# Patient Record
Sex: Male | Born: 1985 | Race: White | Hispanic: No | Marital: Married | Smoking: Light tobacco smoker
Health system: Southern US, Community
[De-identification: ages and names within clinical notes are randomized; demographics above are authoritative.]

## PROBLEM LIST (undated history)

## (undated) DIAGNOSIS — F909 Attention-deficit hyperactivity disorder, unspecified type: Secondary | ICD-10-CM

## (undated) DIAGNOSIS — J45909 Unspecified asthma, uncomplicated: Secondary | ICD-10-CM

## (undated) DIAGNOSIS — F429 Obsessive-compulsive disorder, unspecified: Secondary | ICD-10-CM

## (undated) DIAGNOSIS — I517 Cardiomegaly: Secondary | ICD-10-CM

## (undated) HISTORY — PX: HERNIA REPAIR: SHX51

## (undated) HISTORY — PX: COLONOSCOPY: SHX174

---

## 2016-01-30 ENCOUNTER — Encounter (HOSPITAL_COMMUNITY): Payer: Self-pay | Admitting: Emergency Medicine

## 2016-01-30 ENCOUNTER — Emergency Department (HOSPITAL_COMMUNITY)
Admission: EM | Admit: 2016-01-30 | Discharge: 2016-01-30 | Disposition: A | Discharge status: Home / Self Care | Payer: Self-pay | Attending: Emergency Medicine | Admitting: Emergency Medicine

## 2016-01-30 ENCOUNTER — Emergency Department (HOSPITAL_COMMUNITY): Payer: Self-pay

## 2016-01-30 DIAGNOSIS — F909 Attention-deficit hyperactivity disorder, unspecified type: Secondary | ICD-10-CM | POA: Insufficient documentation

## 2016-01-30 DIAGNOSIS — K1120 Sialoadenitis, unspecified: Secondary | ICD-10-CM

## 2016-01-30 DIAGNOSIS — R590 Localized enlarged lymph nodes: Secondary | ICD-10-CM

## 2016-01-30 HISTORY — DX: Attention-deficit hyperactivity disorder, unspecified type: F90.9

## 2016-01-30 HISTORY — DX: Obsessive-compulsive disorder, unspecified: F42.9

## 2016-01-30 LAB — CBC WITH DIFFERENTIAL/PLATELET
BASOS ABS: 0 10*3/uL (ref 0.0–0.1)
Basophils Relative: 1 %
Eosinophils Absolute: 0.2 10*3/uL (ref 0.0–0.7)
Eosinophils Relative: 2 %
HEMATOCRIT: 43.3 % (ref 39.0–52.0)
HEMOGLOBIN: 14.4 g/dL (ref 13.0–17.0)
LYMPHS PCT: 17 %
Lymphs Abs: 1.4 10*3/uL (ref 0.7–4.0)
MCH: 29.6 pg (ref 26.0–34.0)
MCHC: 33.3 g/dL (ref 30.0–36.0)
MCV: 88.9 fL (ref 78.0–100.0)
Monocytes Absolute: 0.8 10*3/uL (ref 0.1–1.0)
Monocytes Relative: 10 %
NEUTROS ABS: 5.7 10*3/uL (ref 1.7–7.7)
NEUTROS PCT: 70 %
Platelets: 173 10*3/uL (ref 150–400)
RBC: 4.87 MIL/uL (ref 4.22–5.81)
RDW: 13.2 % (ref 11.5–15.5)
WBC: 8.1 10*3/uL (ref 4.0–10.5)

## 2016-01-30 LAB — BASIC METABOLIC PANEL
ANION GAP: 6 (ref 5–15)
BUN: 15 mg/dL (ref 6–20)
CO2: 24 mmol/L (ref 22–32)
Calcium: 8.9 mg/dL (ref 8.9–10.3)
Chloride: 104 mmol/L (ref 101–111)
Creatinine, Ser: 0.79 mg/dL (ref 0.61–1.24)
GLUCOSE: 115 mg/dL — AB (ref 65–99)
POTASSIUM: 3.8 mmol/L (ref 3.5–5.1)
SODIUM: 134 mmol/L — AB (ref 135–145)

## 2016-01-30 MED ORDER — AMOXICILLIN-POT CLAVULANATE 875-125 MG PO TABS
1.0000 | ORAL_TABLET | Freq: Once | ORAL | Status: AC
  Administered 2016-01-30: 1 via ORAL
  Filled 2016-01-30: qty 1

## 2016-01-30 MED ORDER — SODIUM CHLORIDE 0.9 % IV BOLUS (SEPSIS)
1000.0000 mL | Freq: Once | INTRAVENOUS | Status: AC
  Administered 2016-01-30: 1000 mL via INTRAVENOUS

## 2016-01-30 MED ORDER — IOPAMIDOL (ISOVUE-300) INJECTION 61%
75.0000 mL | Freq: Once | INTRAVENOUS | Status: AC | PRN
  Administered 2016-01-30: 75 mL via INTRAVENOUS

## 2016-01-30 MED ORDER — AMOXICILLIN-POT CLAVULANATE 875-125 MG PO TABS: 1.0000 | ORAL_TABLET | Freq: Two times a day (BID) | ORAL | 0 refills | Status: DC

## 2016-01-30 MED ORDER — KETOROLAC TROMETHAMINE 30 MG/ML IJ SOLN
30.0000 mg | Freq: Once | INTRAMUSCULAR | Status: AC
  Administered 2016-01-30: 30 mg via INTRAVENOUS
  Filled 2016-01-30: qty 1

## 2016-01-30 MED ORDER — DEXAMETHASONE SODIUM PHOSPHATE 10 MG/ML IJ SOLN
10.0000 mg | Freq: Once | INTRAMUSCULAR | Status: AC
  Administered 2016-01-30: 10 mg via INTRAVENOUS
  Filled 2016-01-30: qty 1

## 2016-01-30 NOTE — Discharge Instructions (Signed)
As discussed, with infection and is important to take all medication as directed, and be sure to follow-up with your physician. In addition, for the next 3 days please use ibuprofen, 400 mg, 3 times daily.  Return here for concerning changes in your condition.

## 2016-01-30 NOTE — ED Provider Notes (Signed)
[facility]-EMERGENCY DEPT Provider Note   CSN: [id number] Arrival date & time: 01/30/16  1242  By signing my name below, I, Rosario AdieWilliam Andrew Hiatt, attest that this documentation has been prepared under the direction and in the presence of Gerhard Munchobert Darely Becknell, MD. Electronically Signed: Rosario AdieWilliam Andrew Hiatt, ED Scribe. 01/30/16. 1:20 PM.  History   Chief Complaint Chief Complaint  Patient presents with  . Facial Pain   The history is provided by the patient and the spouse. No language interpreter was used.    HPI Comments: Bruce Nguyen is a 30 y.o. male who presents to the Emergency Department complaining of gradually worsening area of pain and mild swelling onset approximately 2 days ago. He states that initial his pain began in the right side of his face, and it is now radiating downward into the right-side of his neck. His pain is exacerbated with palpation and direct pressure.  Pt has been taking Tylenol at home for his pain with minimal relief. Per wife, he also has been c/o right-sided headaches over the past several weeks as well. She states that he wakes up with these headaches, and Tylenol has not been providing relief either. No recent falls or trauma. Pt is a non-smoker and an occasional social drinker. No h/o DM. He denies loss of hearing, difficulty swallowing, voice changes, shortness of breath, fever, sore throat, dental pain, or any other associated symptoms.   Past Medical History:  Diagnosis Date  . ADHD   . OCD (obsessive compulsive disorder)    There are no active problems to display for this patient.  Past Surgical History:  Procedure Laterality Date  . COLONOSCOPY      Home Medications    Prior to Admission medications   Medication Sig Start Date End Date Taking? Authorizing Provider  albuterol (PROVENTIL HFA;VENTOLIN HFA) 108 (90 Base) MCG/ACT inhaler Inhale 1-2 puffs into the lungs every 6 (six) hours as needed for wheezing or shortness of breath.   Yes Historical  Provider, MD  amoxicillin-clavulanate (AUGMENTIN) 875-125 MG tablet Take 1 tablet by mouth every 12 (twelve) hours. 01/30/16   Gerhard Munchobert Lyndzie Zentz, MD   Family History Family History  Problem Relation Age of Onset  . Diabetes Father   . Diabetes Other   . Thyroid disease Sister    Social History Social History  Substance Use Topics  . Smoking status: Never Smoker  . Smokeless tobacco: Never Used  . Alcohol use Yes     Comment: occas   Allergies   Patient has no known allergies.  Review of Systems Review of Systems  Constitutional: Negative for fever.  HENT: Negative for dental problem, hearing loss, sore throat, trouble swallowing and voice change.   Respiratory: Negative for shortness of breath.   Musculoskeletal: Positive for myalgias and neck pain.  Neurological: Positive for headaches.  All other systems reviewed and are negative.  Physical Exam Updated Vital Signs BP 108/64   Pulse 83   Temp 98.2 F (36.8 C) (Oral)   Resp 16   Ht 6' (1.829 m)   Wt 290 lb (131.5 kg)   SpO2 96%   BMI 39.33 kg/m   Physical Exam  Constitutional: He appears well-developed and well-nourished. No distress.  HENT:  Head: Normocephalic and atraumatic.  Right Ear: Tympanic membrane and ear canal normal. Tympanic membrane is not erythematous.  Left Ear: Tympanic membrane and ear canal normal. Tympanic membrane is not erythematous.  Mouth/Throat: Uvula is midline, oropharynx is clear and moist and mucous membranes are normal.  No trismus in the jaw. No uvula swelling. No oropharyngeal exudate, posterior oropharyngeal edema, posterior oropharyngeal erythema or tonsillar abscesses. No tonsillar exudate.  Preauricular adenopathy and cervical lymphadenopathy on the right side is noted. Teeth are otherwise unremarkable.   Eyes: Conjunctivae are normal.  Neck: Normal range of motion.  Cardiovascular: Normal rate, regular rhythm and normal heart sounds.   No murmur heard. Pulmonary/Chest: Effort  normal and breath sounds normal. No respiratory distress. He has no wheezes. He has no rales.  Abdominal: He exhibits no distension.  Musculoskeletal: Normal range of motion.  Neurological: He is alert.  Skin: No pallor.  Psychiatric: He has a normal mood and affect. His behavior is normal.  Nursing note and vitals reviewed.  ED Treatments / Results  DIAGNOSTIC STUDIES: Oxygen Saturation is 97% on RA, normal by my interpretation.   COORDINATION OF CARE: 1:20 PM-Discussed next steps with pt. Pt verbalized understanding and is agreeable with the plan.   Labs (all labs ordered are listed, but only abnormal results are displayed) Labs Reviewed  BASIC METABOLIC PANEL - Abnormal; Notable for the following:       Result Value   Sodium 134 (*)    Glucose, Bld 115 (*)    All other components within normal limits  CBC WITH DIFFERENTIAL/PLATELET    EKG  EKG Interpretation None      Radiology Ct Soft Tissue Neck W Contrast  Result Date: 01/30/2016 CLINICAL DATA:  Right lateral neck pain and swelling. Palpable nodule just below the right ear. EXAM: CT NECK WITH CONTRAST TECHNIQUE: Multidetector CT imaging of the neck was performed using the standard protocol following the bolus administration of intravenous contrast. CONTRAST:  75mL ISOVUE-300 IOPAMIDOL (ISOVUE-300) INJECTION 61% COMPARISON:  None. FINDINGS: Pharynx and larynx: The palatine tonsils are enlarged bilaterally. The soft palate and adenoid tissue is enlarged. The airway is patent although narrowed in the posterior oropharynx. There is prominence of the lingual tonsils. No focal mucosal or submucosal mass lesion is present. Salivary glands: A 10 mm hyperdense nodule within the right parotid gland likely represents a lymph node. No other focal lesions are present within either parotid gland. The submandibular glands are within normal limits. Thyroid: Negative Lymph nodes: Enlarged lymph node with some surrounding inflammatory  changes is present adjacent to the superior aspect of the right parotid gland, just anterior and inferior to the right external auditory canal. Prominent bilateral level 2 lymph nodes are noted. subcentimeter submandibular lymph nodes are present as well. Asymmetric subcentimeter level 3 nodes are present on the right. Vascular: No significant vascular lesions are present. Limited intracranial: Unremarkable. Visualized orbits: The globes and orbits are within normal limits. Mastoids and visualized paranasal sinuses: The mastoids and paranasal sinuses are clear. Skeleton: No focal lytic or blastic lesions are present. AP alignment is within normal limits in the cervical spine. Upper chest: The lung apices are clear. The superior mediastinum is within normal limits. IMPRESSION: 1. Inflammatory lymph node of the upper pole of the right parotid gland likely accounts for the full palpable lesion. This may be related to parotitis. No obstructing mass lesion is present. 2. Probable right intra parotid lymph node. Benign parotid neoplasm is considered less likely. Recommend follow-up CT of the neck with contrast at 3- 6 months. 3. Bilateral cervical adenopathy, right greater than left. This appears reactive. 4. Extensive prominence of the palatine tonsils, soft palate, adenoid tissues suggesting pharyngitis. No discrete mass lesion or abscess is evident. Electronically Signed   By: Cristal Deer  Mattern M.D.   On: 01/30/2016 15:01    Procedures Procedures   Medications Ordered in ED Medications  amoxicillin-clavulanate (AUGMENTIN) 875-125 MG per tablet 1 tablet (not administered)  sodium chloride 0.9 % bolus 1,000 mL (0 mLs Intravenous Stopped 01/30/16 1439)  ketorolac (TORADOL) 30 MG/ML injection 30 mg (30 mg Intravenous Given 01/30/16 1342)  dexamethasone (DECADRON) injection 10 mg (10 mg Intravenous Given 01/30/16 1342)  iopamidol (ISOVUE-300) 61 % injection 75 mL (75 mLs Intravenous Contrast Given 01/30/16 1427)      Initial Impression / Assessment and Plan / ED Course  I have reviewed the triage vital signs and the nursing notes.  Pertinent labs & imaging results that were available during my care of the patient were reviewed by me and considered in my medical decision making (see chart for details).  Clinical Course   On repeat exam the patient's pain has diminished substantially. I discussed all findings with him and his wife. We discussed important to take all medication as directed, return precautions and follow-up instructions. With evidence for peritonitis, adenopathy, no evidence for bacteremia or sepsis, or respiratory compromise, the patient was discharged after initiation of antibiotics here.   Final Clinical Impressions(s) / ED Diagnoses  Parotitis Cervical adenopathy  New Prescriptions New Prescriptions   AMOXICILLIN-CLAVULANATE (AUGMENTIN) 875-125 MG TABLET    Take 1 tablet by mouth every 12 (twelve) hours.   I personally performed the services described in this documentation, which was scribed in my presence. The recorded information has been reviewed and is accurate.     Gerhard Munchobert Kymora Sciara, MD 01/30/16 1539

## 2016-01-30 NOTE — ED Triage Notes (Signed)
Pt reports a small area of inflammation to his R face at the ear area. Pt states the symptoms have increased with the size of the inflammation now going from in front of his ear down into his neck. Pt states he is unable to turn his head to the R.

## 2016-07-14 ENCOUNTER — Emergency Department (HOSPITAL_COMMUNITY)
Admission: EM | Admit: 2016-07-14 | Discharge: 2016-07-14 | Disposition: A | Discharge status: Home / Self Care | Payer: Self-pay | Attending: Emergency Medicine | Admitting: Emergency Medicine

## 2016-07-14 ENCOUNTER — Emergency Department (HOSPITAL_COMMUNITY): Payer: Self-pay

## 2016-07-14 ENCOUNTER — Encounter (HOSPITAL_COMMUNITY): Payer: Self-pay | Admitting: *Deleted

## 2016-07-14 DIAGNOSIS — R0789 Other chest pain: Secondary | ICD-10-CM | POA: Insufficient documentation

## 2016-07-14 DIAGNOSIS — J45909 Unspecified asthma, uncomplicated: Secondary | ICD-10-CM | POA: Insufficient documentation

## 2016-07-14 DIAGNOSIS — F909 Attention-deficit hyperactivity disorder, unspecified type: Secondary | ICD-10-CM | POA: Insufficient documentation

## 2016-07-14 DIAGNOSIS — R0602 Shortness of breath: Secondary | ICD-10-CM | POA: Insufficient documentation

## 2016-07-14 HISTORY — DX: Cardiomegaly: I51.7

## 2016-07-14 HISTORY — DX: Unspecified asthma, uncomplicated: J45.909

## 2016-07-14 MED ORDER — IBUPROFEN 600 MG PO TABS: 600.0000 mg | ORAL_TABLET | Freq: Three times a day (TID) | ORAL | 0 refills | Status: DC | PRN

## 2016-07-14 MED ORDER — OXYCODONE-ACETAMINOPHEN 5-325 MG PO TABS: 1.0000 | ORAL_TABLET | ORAL | 0 refills | Status: DC | PRN

## 2016-07-14 NOTE — ED Provider Notes (Signed)
[facility] Provider Note   CSN: [id number] Arrival date & time: 07/14/16  1654     History   Chief Complaint Chief Complaint  Patient presents with  . Shortness of Breath    HPI Bruce Nguyen is a 31 y.o. male.  HPI Patient presents with chest pain. Began acutely one week ago when he woke up. Worse with movements and breathing. No fevers. No cough. No trauma. Has not had pains like this before. Has been told that a enlarged heart in the past but states he did not take the medicine but it just went away. No swelling in his legs. Does not smoke. No recent illnesses. No real difficulty breathing but states it does get a little short of breath when he takes a deep breath, due to the pain.  Past Medical History:  Diagnosis Date  . ADHD   . Asthma   . Enlarged heart   . OCD (obsessive compulsive disorder)     There are no active problems to display for this patient.   Past Surgical History:  Procedure Laterality Date  . COLONOSCOPY         Home Medications    Prior to Admission medications   Medication Sig Start Date End Date Taking? Authorizing Provider  albuterol (PROVENTIL HFA;VENTOLIN HFA) 108 (90 Base) MCG/ACT inhaler Inhale 1-2 puffs into the lungs every 6 (six) hours as needed for wheezing or shortness of breath.    [provider]  amoxicillin-clavulanate (AUGMENTIN) 875-125 MG tablet Take 1 tablet by mouth every 12 (twelve) hours. 01/30/16   Gerhard Munch, MD  ibuprofen (ADVIL,MOTRIN) 600 MG tablet Take 1 tablet (600 mg total) by mouth every 8 (eight) hours as needed. 07/14/16   Benjiman Core, MD  oxyCODONE-acetaminophen (PERCOCET/ROXICET) 5-325 MG tablet Take 1 tablet by mouth every 4 (four) hours as needed for severe pain. 07/14/16   Benjiman Core, MD    Family History Family History  Problem Relation Age of Onset  . Diabetes Father   . Diabetes Other   . Thyroid disease Sister     Social History Social History  Substance  Use Topics  . Smoking status: Never Smoker  . Smokeless tobacco: Never Used  . Alcohol use Yes     Comment: occas     Allergies   Patient has no known allergies.   Review of Systems Review of Systems  Constitutional: Negative for activity change, appetite change, chills and fever.  HENT: Negative for congestion.   Eyes: Negative for pain.  Respiratory: Negative for chest tightness and shortness of breath.   Cardiovascular: Positive for chest pain. Negative for leg swelling.  Gastrointestinal: Negative for abdominal pain, diarrhea, nausea and vomiting.  Genitourinary: Negative for flank pain.  Musculoskeletal: Negative for back pain and neck stiffness.  Skin: Negative for rash.  Neurological: Negative for weakness, numbness and headaches.  Psychiatric/Behavioral: Negative for behavioral problems.     Physical Exam Updated Vital Signs BP (!) 123/93 (BP Location: Right Arm)   Pulse 85   Temp 97.7 F (36.5 C) (Oral)   Resp 18   Ht 6\' 1"  (1.854 m)   Wt (!) 140.6 kg (310 lb)   SpO2 95%   BMI 40.90 kg/m   Physical Exam  Constitutional: He appears well-developed.  HENT:  Head: Atraumatic.  Cardiovascular: Normal rate.   Pulmonary/Chest: Effort normal. He exhibits tenderness.  Moderate tenderness over anterior lower chest. No crepitance. No deformity.  Abdominal: He exhibits no mass. There is no tenderness.  Musculoskeletal: He exhibits no edema.  Neurological: He is alert.  Skin: Skin is warm. Capillary refill takes less than 2 seconds.     ED Treatments / Results  Labs (all labs ordered are listed, but only abnormal results are displayed) Labs Reviewed - No data to display  EKG  EKG Interpretation  Date/Time:  Wednesday Jul 14 2016 17:48:36 EDT Ventricular Rate:  76 PR Interval:    QRS Duration: 91 QT Interval:  369 QTC Calculation: 415 R Axis:   81 Text Interpretation:  Sinus rhythm ST elev, probable normal early repol pattern Confirmed by Rubin PayorPICKERING   MD, Harrold DonathNATHAN [phone] on 07/14/2016 6:08:47 PM       Radiology Dg Chest 2 View  Result Date: 07/14/2016 CLINICAL DATA:  Worsening lower LEFT rib pain and shortness of breath for 1 week, dry cough today, history asthma, cardiomegaly EXAM: CHEST  2 VIEW COMPARISON:  None FINDINGS: Normal heart size, mediastinal contours, and pulmonary vascularity. Lungs clear. No pleural effusion or pneumothorax. Bones unremarkable. IMPRESSION: Normal exam. Electronically Signed   By: Ulyses SouthwardMark  Boles M.D.   On: 07/14/2016 18:14    Procedures Procedures (including critical care time)  Medications Ordered in ED Medications - No data to display   Initial Impression / Assessment and Plan / ED Course  I have reviewed the triage vital signs and the nursing notes.  Pertinent labs & imaging results that were available during my care of the patient were reviewed by me and considered in my medical decision making (see chart for details).     Patient with chest pain. Left anterior lower. Worse with palpitations. EKG reassuring. X-ray negative. With further questioning from the nurse later said that he did pick up a friend physically before the pain started. Doubt cardiac cause. Will discharge home.  Final Clinical Impressions(s) / ED Diagnoses   Final diagnoses:  Chest wall pain    New Prescriptions Discharge Medication List as of 07/14/2016  6:37 PM    START taking these medications   Details  ibuprofen (ADVIL,MOTRIN) 600 MG tablet Take 1 tablet (600 mg total) by mouth every 8 (eight) hours as needed., Starting Wed 07/14/2016, Print    oxyCODONE-acetaminophen (PERCOCET/ROXICET) 5-325 MG tablet Take 1 tablet by mouth every 4 (four) hours as needed for severe pain., Starting Wed 07/14/2016, Print         Benjiman CorePickering, Abrie Egloff, MD 07/14/16 [phone]

## 2016-07-14 NOTE — ED Notes (Signed)
Pt do recall horse playing with a friend and picked him to body slam.  Pt says pain is to left lower chest and goes around to his back, rating pain 8-9/10.

## 2016-07-14 NOTE — ED Triage Notes (Signed)
Pt comes in with left rib pain starting 1 week ago. Pt states it becomes are hard him to breathe at times. Denies any injury.

## 2017-04-18 ENCOUNTER — Other Ambulatory Visit: Payer: Self-pay

## 2017-04-18 ENCOUNTER — Emergency Department (HOSPITAL_COMMUNITY)
Admission: EM | Admit: 2017-04-18 | Discharge: 2017-04-18 | Disposition: A | Discharge status: Home / Self Care | Payer: Self-pay | Attending: Emergency Medicine | Admitting: Emergency Medicine

## 2017-04-18 ENCOUNTER — Encounter (HOSPITAL_COMMUNITY): Payer: Self-pay | Admitting: Emergency Medicine

## 2017-04-18 ENCOUNTER — Emergency Department (HOSPITAL_COMMUNITY): Payer: Self-pay

## 2017-04-18 DIAGNOSIS — J4541 Moderate persistent asthma with (acute) exacerbation: Secondary | ICD-10-CM | POA: Insufficient documentation

## 2017-04-18 DIAGNOSIS — F909 Attention-deficit hyperactivity disorder, unspecified type: Secondary | ICD-10-CM | POA: Insufficient documentation

## 2017-04-18 DIAGNOSIS — J45909 Unspecified asthma, uncomplicated: Secondary | ICD-10-CM | POA: Insufficient documentation

## 2017-04-18 DIAGNOSIS — F17200 Nicotine dependence, unspecified, uncomplicated: Secondary | ICD-10-CM | POA: Insufficient documentation

## 2017-04-18 MED ORDER — ALBUTEROL SULFATE (2.5 MG/3ML) 0.083% IN NEBU: 2.5000 mg | INHALATION_SOLUTION | RESPIRATORY_TRACT | 1 refills | Status: AC | PRN

## 2017-04-18 MED ORDER — PREDNISONE 20 MG PO TABS: ORAL_TABLET | ORAL | 0 refills | Status: DC

## 2017-04-18 MED ORDER — IPRATROPIUM-ALBUTEROL 0.5-2.5 (3) MG/3ML IN SOLN
3.0000 mL | Freq: Once | RESPIRATORY_TRACT | Status: AC
  Administered 2017-04-18: 3 mL via RESPIRATORY_TRACT
  Filled 2017-04-18: qty 3

## 2017-04-18 MED ORDER — ALBUTEROL SULFATE (2.5 MG/3ML) 0.083% IN NEBU
2.5000 mg | INHALATION_SOLUTION | Freq: Once | RESPIRATORY_TRACT | Status: AC
  Administered 2017-04-18: 2.5 mg via RESPIRATORY_TRACT
  Filled 2017-04-18: qty 3

## 2017-04-18 MED ORDER — ALBUTEROL SULFATE HFA 108 (90 BASE) MCG/ACT IN AERS
2.0000 | INHALATION_SPRAY | Freq: Once | RESPIRATORY_TRACT | Status: AC
  Administered 2017-04-18: 2 via RESPIRATORY_TRACT
  Filled 2017-04-18: qty 6.7

## 2017-04-18 MED ORDER — PREDNISONE 50 MG PO TABS
60.0000 mg | ORAL_TABLET | Freq: Once | ORAL | Status: AC
  Administered 2017-04-18: 60 mg via ORAL
  Filled 2017-04-18: qty 1

## 2017-04-18 NOTE — ED Provider Notes (Signed)
[facility] EMERGENCY DEPARTMENT Provider Note   CSN: [id number] Arrival date & time: 04/18/17  7829     History   Chief Complaint Chief Complaint  Patient presents with  . Shortness of Breath    HPI Bruce Nguyen is a 32 y.o. male.  Patient complains of shortness of breath and wheezing.  Patient has history of asthma   The history is provided by the patient. No language interpreter was used.  Shortness of Breath  This is a recurrent problem. The problem occurs frequently.The current episode started more than 2 days ago. The problem has not changed since onset.Associated symptoms include wheezing. Pertinent negatives include no fever, no headaches, no cough, no chest pain, no abdominal pain and no rash. It is unknown what precipitated the problem.    Past Medical History:  Diagnosis Date  . ADHD   . Asthma   . Enlarged heart   . OCD (obsessive compulsive disorder)     There are no active problems to display for this patient.   Past Surgical History:  Procedure Laterality Date  . COLONOSCOPY         Home Medications    Prior to Admission medications   Medication Sig Start Date End Date Taking? Authorizing Provider  albuterol (PROVENTIL HFA;VENTOLIN HFA) 108 (90 Base) MCG/ACT inhaler Inhale 1-2 puffs into the lungs every 6 (six) hours as needed for wheezing or shortness of breath.   Yes [provider]  ibuprofen (ADVIL,MOTRIN) 200 MG tablet Take 400 mg by mouth every 6 (six) hours as needed.   Yes [provider]  albuterol (PROVENTIL) (2.5 MG/3ML) 0.083% nebulizer solution Take 3 mLs (2.5 mg total) by nebulization every 4 (four) hours as needed for wheezing or shortness of breath. 04/18/17   Bethann Berkshire, MD  amoxicillin-clavulanate (AUGMENTIN) 875-125 MG tablet Take 1 tablet by mouth every 12 (twelve) hours. Patient not taking: Reported on 04/18/2017 01/30/16   Gerhard Munch, MD  ibuprofen (ADVIL,MOTRIN) 600 MG tablet Take 1 tablet (600  mg total) by mouth every 8 (eight) hours as needed. Patient not taking: Reported on 04/18/2017 07/14/16   Benjiman Core, MD  oxyCODONE-acetaminophen (PERCOCET/ROXICET) 5-325 MG tablet Take 1 tablet by mouth every 4 (four) hours as needed for severe pain. Patient not taking: Reported on 04/18/2017 07/14/16   Benjiman Core, MD  predniSONE (DELTASONE) 20 MG tablet 2 tabs po daily x 3 days 04/18/17   Bethann Berkshire, MD    Family History Family History  Problem Relation Age of Onset  . Diabetes Father   . Diabetes Other   . Thyroid disease Sister     Social History Social History   Tobacco Use  . Smoking status: Light Tobacco Smoker  . Smokeless tobacco: Never Used  Substance Use Topics  . Alcohol use: Yes    Comment: occas  . Drug use: No     Allergies   Patient has no known allergies.   Review of Systems Review of Systems  Constitutional: Negative for appetite change, fatigue and fever.  HENT: Negative for congestion, ear discharge and sinus pressure.   Eyes: Negative for discharge.  Respiratory: Positive for shortness of breath and wheezing. Negative for cough.   Cardiovascular: Negative for chest pain.  Gastrointestinal: Negative for abdominal pain and diarrhea.  Genitourinary: Negative for frequency and hematuria.  Musculoskeletal: Negative for back pain.  Skin: Negative for rash.  Neurological: Negative for seizures and headaches.  Psychiatric/Behavioral: Negative for hallucinations.     Physical Exam Updated Vital  Signs BP (!) 144/61   Pulse 99   Temp (!) 97.5 F (36.4 C) (Oral)   Resp 15   Ht 6\' 1"  (1.854 m)   Wt (!) 142.9 kg (315 lb)   SpO2 98%   BMI 41.56 kg/m   Physical Exam  Constitutional: He is oriented to person, place, and time. He appears well-developed.  HENT:  Head: Normocephalic.  Eyes: Conjunctivae and EOM are normal. No scleral icterus.  Neck: Neck supple. No thyromegaly present.  Cardiovascular: Normal rate and regular rhythm.  Exam reveals no gallop and no friction rub.  No murmur heard. Pulmonary/Chest: No stridor. He has wheezes. He has no rales. He exhibits no tenderness.  Abdominal: He exhibits no distension. There is no tenderness. There is no rebound.  Musculoskeletal: Normal range of motion. He exhibits no edema.  Lymphadenopathy:    He has no cervical adenopathy.  Neurological: He is oriented to person, place, and time. He exhibits normal muscle tone. Coordination normal.  Skin: No rash noted. No erythema.  Psychiatric: He has a normal mood and affect. His behavior is normal.     ED Treatments / Results  Labs (all labs ordered are listed, but only abnormal results are displayed) Labs Reviewed - No data to display  EKG  EKG Interpretation None       Radiology Dg Chest 2 View  Result Date: 04/18/2017 CLINICAL DATA:  Cough and shortness of breath since last night. EXAM: CHEST  2 VIEW COMPARISON:  PA and lateral chest 07/14/2016. FINDINGS: The lungs are clear. Heart size is normal. No pneumothorax or pleural fluid. No acute bony abnormality. IMPRESSION: Negative chest. Electronically Signed   By: Drusilla Kannerhomas  Dalessio M.D.   On: 04/18/2017 10:35    Procedures Procedures (including critical care time)  Medications Ordered in ED Medications  ipratropium-albuterol (DUONEB) 0.5-2.5 (3) MG/3ML nebulizer solution 3 mL (3 mLs Nebulization Given 04/18/17 1056)  predniSONE (DELTASONE) tablet 60 mg (60 mg Oral Given 04/18/17 1219)  ipratropium-albuterol (DUONEB) 0.5-2.5 (3) MG/3ML nebulizer solution 3 mL (3 mLs Nebulization Given 04/18/17 1333)  albuterol (PROVENTIL) (2.5 MG/3ML) 0.083% nebulizer solution 2.5 mg (2.5 mg Nebulization Given 04/18/17 1334)     Initial Impression / Assessment and Plan / ED Course  I have reviewed the triage vital signs and the nursing notes.  Pertinent labs & imaging results that were available during my care of the patient were reviewed by me and considered in my medical  decision making (see chart for details).   Chest x-ray unremarkable.  Patient has history of asthma and has been wheezing and the neb treatments seem to help him.  He will be discharged home with albuterol and prednisone will follow up with PCP Final Clinical Impressions(s) / ED Diagnoses   Final diagnoses:  Moderate persistent asthma with exacerbation    ED Discharge Orders        Ordered    predniSONE (DELTASONE) 20 MG tablet     04/18/17 1443    albuterol (PROVENTIL) (2.5 MG/3ML) 0.083% nebulizer solution  Every 4 hours PRN     04/18/17 1443       Bethann BerkshireZammit, Valeri Sula, MD 04/18/17 1447

## 2017-04-18 NOTE — ED Triage Notes (Signed)
Pt stated he had an asthma attack last night. Reports little relief with inhaler. Pt also reports bilateral rib cage pain. NAD noted.

## 2017-04-18 NOTE — Discharge Instructions (Signed)
Follow-up with your family doctor in the next couple weeks for recheck return if problems

## 2017-04-18 NOTE — ED Notes (Signed)
Pt reports ease of breathing. No noted distress, pt playing on phone at this time.

## 2017-04-18 NOTE — ED Notes (Signed)
Respiratory paged at this time for tx.  

## 2017-07-16 ENCOUNTER — Encounter (HOSPITAL_COMMUNITY): Payer: Self-pay | Admitting: Emergency Medicine

## 2017-07-16 ENCOUNTER — Emergency Department (HOSPITAL_COMMUNITY)
Admission: EM | Admit: 2017-07-16 | Discharge: 2017-07-17 | Disposition: A | Discharge status: Home / Self Care | Payer: Self-pay | Attending: Emergency Medicine | Admitting: Emergency Medicine

## 2017-07-16 ENCOUNTER — Other Ambulatory Visit: Payer: Self-pay

## 2017-07-16 DIAGNOSIS — Y999 Unspecified external cause status: Secondary | ICD-10-CM | POA: Insufficient documentation

## 2017-07-16 DIAGNOSIS — X30XXXA Exposure to excessive natural heat, initial encounter: Secondary | ICD-10-CM | POA: Insufficient documentation

## 2017-07-16 DIAGNOSIS — F17200 Nicotine dependence, unspecified, uncomplicated: Secondary | ICD-10-CM | POA: Insufficient documentation

## 2017-07-16 DIAGNOSIS — J45909 Unspecified asthma, uncomplicated: Secondary | ICD-10-CM | POA: Insufficient documentation

## 2017-07-16 DIAGNOSIS — Y92007 Garden or yard of unspecified non-institutional (private) residence as the place of occurrence of the external cause: Secondary | ICD-10-CM | POA: Insufficient documentation

## 2017-07-16 DIAGNOSIS — E860 Dehydration: Secondary | ICD-10-CM

## 2017-07-16 DIAGNOSIS — T675XXA Heat exhaustion, unspecified, initial encounter: Secondary | ICD-10-CM

## 2017-07-16 DIAGNOSIS — R112 Nausea with vomiting, unspecified: Secondary | ICD-10-CM | POA: Insufficient documentation

## 2017-07-16 DIAGNOSIS — Y93H2 Activity, gardening and landscaping: Secondary | ICD-10-CM | POA: Insufficient documentation

## 2017-07-16 NOTE — ED Triage Notes (Signed)
Per [facility] pt c/o of abd pain x 1 hour with no v/d, all v/s WNL, pt in no acute distress

## 2017-07-17 LAB — CBC WITH DIFFERENTIAL/PLATELET
BASOS ABS: 0 10*3/uL (ref 0.0–0.1)
BASOS PCT: 0 %
EOS ABS: 0 10*3/uL (ref 0.0–0.7)
Eosinophils Relative: 0 %
HCT: 47.8 % (ref 39.0–52.0)
Hemoglobin: 15.5 g/dL (ref 13.0–17.0)
Lymphocytes Relative: 2 %
Lymphs Abs: 0.3 10*3/uL — ABNORMAL LOW (ref 0.7–4.0)
MCH: 29 pg (ref 26.0–34.0)
MCHC: 32.4 g/dL (ref 30.0–36.0)
MCV: 89.3 fL (ref 78.0–100.0)
MONOS PCT: 6 %
Monocytes Absolute: 0.8 10*3/uL (ref 0.1–1.0)
NEUTROS ABS: 11.7 10*3/uL — AB (ref 1.7–7.7)
Neutrophils Relative %: 92 %
Platelets: 163 10*3/uL (ref 150–400)
RBC: 5.35 MIL/uL (ref 4.22–5.81)
RDW: 13.3 % (ref 11.5–15.5)
WBC: 12.8 10*3/uL — AB (ref 4.0–10.5)

## 2017-07-17 LAB — URINALYSIS, ROUTINE W REFLEX MICROSCOPIC
Bilirubin Urine: NEGATIVE
Glucose, UA: NEGATIVE mg/dL
Hgb urine dipstick: NEGATIVE
KETONES UR: NEGATIVE mg/dL
LEUKOCYTES UA: NEGATIVE
NITRITE: NEGATIVE
Protein, ur: NEGATIVE mg/dL
Specific Gravity, Urine: 1.019 (ref 1.005–1.030)
pH: 6 (ref 5.0–8.0)

## 2017-07-17 LAB — COMPREHENSIVE METABOLIC PANEL
ALT: 48 U/L (ref 17–63)
ANION GAP: 8 (ref 5–15)
AST: 28 U/L (ref 15–41)
Albumin: 3.7 g/dL (ref 3.5–5.0)
Alkaline Phosphatase: 48 U/L (ref 38–126)
BILIRUBIN TOTAL: 0.9 mg/dL (ref 0.3–1.2)
BUN: 16 mg/dL (ref 6–20)
CALCIUM: 8.6 mg/dL — AB (ref 8.9–10.3)
CO2: 25 mmol/L (ref 22–32)
CREATININE: 0.86 mg/dL (ref 0.61–1.24)
Chloride: 106 mmol/L (ref 101–111)
Glucose, Bld: 110 mg/dL — ABNORMAL HIGH (ref 65–99)
Potassium: 4.3 mmol/L (ref 3.5–5.1)
Sodium: 139 mmol/L (ref 135–145)
TOTAL PROTEIN: 7 g/dL (ref 6.5–8.1)

## 2017-07-17 LAB — CK: CK TOTAL: 145 U/L (ref 49–397)

## 2017-07-17 MED ORDER — ONDANSETRON HCL 4 MG/2ML IJ SOLN
4.0000 mg | Freq: Once | INTRAMUSCULAR | Status: AC
  Administered 2017-07-17: 4 mg via INTRAVENOUS
  Filled 2017-07-17: qty 2

## 2017-07-17 MED ORDER — DICYCLOMINE HCL 10 MG/ML IM SOLN
20.0000 mg | Freq: Once | INTRAMUSCULAR | Status: AC
  Administered 2017-07-17: 20 mg via INTRAMUSCULAR
  Filled 2017-07-17: qty 2

## 2017-07-17 MED ORDER — SODIUM CHLORIDE 0.9 % IV BOLUS
1000.0000 mL | Freq: Once | INTRAVENOUS | Status: AC
  Administered 2017-07-17: 1000 mL via INTRAVENOUS

## 2017-07-17 MED ORDER — PROMETHAZINE HCL 25 MG PO TABS: 25.0000 mg | ORAL_TABLET | Freq: Four times a day (QID) | ORAL | 0 refills | Status: DC | PRN

## 2017-07-17 NOTE — ED Provider Notes (Signed)
[facility] EMERGENCY DEPARTMENT Provider Note   CSN: [id number] Arrival date & time: 07/16/17  2153  Time seen 12:20 AM   History   Chief Complaint Chief Complaint  Patient presents with  . Abdominal Pain    HPI Bruce Nguyen is a 32 y.o. male.  HPI patient states this morning he spent time pulling water out of an old well.  He states the water has a greenish tent to it and they do not use it for eating or drinking but they use it for flushing the toilet and sometimes he splashes it on his face.  This evening he was helping somebody do yard work for about 30 to 60 minutes.  It was hot today in the 90s, patient states he was sweating a lot, he complained of feeling very thirsty.  He states he came in the house and drank some water.  However about 4 PM he started having some jabbing pains that shoot around in his abdomen but mainly in the left upper quadrant.  He states about 9:45 PM he had nausea and vomiting with one prolonged episode with about 7 or 8 vomiting is.  He denies diarrhea or fever.  He states the pain is sharp and lasts about 15 seconds and then is aching.  He has not been around anybody else who is ill, he is never had this before.  He denies eating any flour from [employer] store which has a [place] recall for E. coli.  He states the only thing different in his diet is he did eat watermelon 2 nights ago.  Also they point out that the house they live and does not have air conditioning.  Patient states he donates plasma 2 times a week.  Last week he had a syncopal episode and they told him he needed to drink more fluids.  The last time he donated this week was the 22nd.  PCP Patient, No Pcp Per   Past Medical History:  Diagnosis Date  . ADHD   . Asthma   . Enlarged heart   . OCD (obsessive compulsive disorder)     There are no active problems to display for this patient.   Past Surgical History:  Procedure Laterality Date  . COLONOSCOPY           Home Medications    None  Prior to Admission medications   Medication Sig Start Date End Date Taking? Authorizing Provider  albuterol (PROVENTIL HFA;VENTOLIN HFA) 108 (90 Base) MCG/ACT inhaler Inhale 1-2 puffs into the lungs every 6 (six) hours as needed for wheezing or shortness of breath.    [provider]  albuterol (PROVENTIL) (2.5 MG/3ML) 0.083% nebulizer solution Take 3 mLs (2.5 mg total) by nebulization every 4 (four) hours as needed for wheezing or shortness of breath. 04/18/17   Bethann Berkshire, MD  amoxicillin-clavulanate (AUGMENTIN) 875-125 MG tablet Take 1 tablet by mouth every 12 (twelve) hours. Patient not taking: Reported on 04/18/2017 01/30/16   Gerhard Munch, MD  ibuprofen (ADVIL,MOTRIN) 200 MG tablet Take 400 mg by mouth every 6 (six) hours as needed.    [provider]  ibuprofen (ADVIL,MOTRIN) 600 MG tablet Take 1 tablet (600 mg total) by mouth every 8 (eight) hours as needed. Patient not taking: Reported on 04/18/2017 07/14/16   Benjiman Core, MD  oxyCODONE-acetaminophen (PERCOCET/ROXICET) 5-325 MG tablet Take 1 tablet by mouth every 4 (four) hours as needed for severe pain. Patient not taking: Reported on 04/18/2017 07/14/16   Benjiman Core,  MD  predniSONE (DELTASONE) 20 MG tablet 2 tabs po daily x 3 days 04/18/17   Bethann Berkshire, MD  promethazine (PHENERGAN) 25 MG tablet Take 1 tablet (25 mg total) by mouth every 6 (six) hours as needed for nausea or vomiting. 07/17/17   Devoria Albe, MD    Family History Family History  Problem Relation Age of Onset  . Diabetes Father   . Diabetes Other   . Thyroid disease Sister     Social History Social History   Tobacco Use  . Smoking status: Light Tobacco Smoker  . Smokeless tobacco: Never Used  Substance Use Topics  . Alcohol use: Yes    Comment: occas  . Drug use: No  lives at home Lives with significant other unemployed  Allergies   Patient has no known allergies.   Review of  Systems Review of Systems  All other systems reviewed and are negative.    Physical Exam Updated Vital Signs BP 133/69   Pulse (!) 108   Temp 98.8 F (37.1 C) (Oral)   Resp 19   Ht  (1.854 m)   Wt (!) 142.4 kg (314 lb)   SpO2 96%   BMI 41.43 kg/m   Vital signs normal except for tachycardia   Physical Exam   ED Treatments / Results  Labs (all labs ordered are listed, but only abnormal results are displayed) Results for orders placed or performed during the hospital encounter of 07/16/17  Comprehensive metabolic panel  Result Value Ref Range   Sodium 139 135 - 145 mmol/L   Potassium 4.3 3.5 - 5.1 mmol/L   Chloride 106 101 - 111 mmol/L   CO2 25 22 - 32 mmol/L   Glucose, Bld 110 (H) 65 - 99 mg/dL   BUN 16 6 - 20 mg/dL   Creatinine, Ser 4.09 0.61 - 1.24 mg/dL   Calcium 8.6 (L) 8.9 - 10.3 mg/dL   Total Protein 7.0 6.5 - 8.1 g/dL   Albumin 3.7 3.5 - 5.0 g/dL   AST 28 15 - 41 U/L   ALT 48 17 - 63 U/L   Alkaline Phosphatase 48 38 - 126 U/L   Total Bilirubin 0.9 0.3 - 1.2 mg/dL   GFR calc non Af Amer >60 >60 mL/min   GFR calc Af Amer >60 >60 mL/min   Anion gap 8 5 - 15  CBC with Differential  Result Value Ref Range   WBC 12.8 (H) 4.0 - 10.5 K/uL   RBC 5.35 4.22 - 5.81 MIL/uL   Hemoglobin 15.5 13.0 - 17.0 g/dL   HCT 81.1 91.4 - 78.2 %   MCV 89.3 78.0 - 100.0 fL   MCH 29.0 26.0 - 34.0 pg   MCHC 32.4 30.0 - 36.0 g/dL   RDW 95.6 21.3 - 08.6 %   Platelets 163 150 - 400 K/uL   Neutrophils Relative % 92 %   Neutro Abs 11.7 (H) 1.7 - 7.7 K/uL   Lymphocytes Relative 2 %   Lymphs Abs 0.3 (L) 0.7 - 4.0 K/uL   Monocytes Relative 6 %   Monocytes Absolute 0.8 0.1 - 1.0 K/uL   Eosinophils Relative 0 %   Eosinophils Absolute 0.0 0.0 - 0.7 K/uL   Basophils Relative 0 %   Basophils Absolute 0.0 0.0 - 0.1 K/uL  Urinalysis, Routine w reflex microscopic  Result Value Ref Range   Color, Urine YELLOW YELLOW   APPearance CLEAR CLEAR   Specific Gravity, Urine 1.019 1.005 -  1.030  pH 6.0 5.0 - 8.0   Glucose, UA NEGATIVE NEGATIVE mg/dL   Hgb urine dipstick NEGATIVE NEGATIVE   Bilirubin Urine NEGATIVE NEGATIVE   Ketones, ur NEGATIVE NEGATIVE mg/dL   Protein, ur NEGATIVE NEGATIVE mg/dL   Nitrite NEGATIVE NEGATIVE   Leukocytes, UA NEGATIVE NEGATIVE  CK  Result Value Ref Range   Total CK 145 49 - 397 U/L   Laboratory interpretation all normal except leukocytosis    EKG None  Radiology No results found.  Procedures Procedures (including critical care time)  Medications Ordered in ED Medications  sodium chloride 0.9 % bolus 1,000 mL (0 mLs Intravenous Stopped 07/17/17 0207)  sodium chloride 0.9 % bolus 1,000 mL (0 mLs Intravenous Stopped 07/17/17 0300)  ondansetron (ZOFRAN) injection 4 mg (4 mg Intravenous Given 07/17/17 0042)  ondansetron (ZOFRAN) injection 4 mg (4 mg Intravenous Given 07/17/17 0316)  dicyclomine (BENTYL) injection 20 mg (20 mg Intramuscular Given 07/17/17 0316)  sodium chloride 0.9 % bolus 1,000 mL (0 mLs Intravenous Stopped 07/17/17 0439)     Initial Impression / Assessment and Plan / ED Course  I have reviewed the triage vital signs and the nursing notes.  Pertinent labs & imaging results that were available during my care of the patient were reviewed by me and considered in my medical decision making (see chart for details).     After talking to the patient I felt like he may have a heat related illness.  CK was added to his blood work.  He was given IV fluids.  He was given Zofran for nausea.  I think his abdominal pains may be more intestinal spasms from the heat illness.  Patient was given Bentyl and is continued to complain of abdominal discomfort that would come and go.  Recheck at 4:15 AM patient states he is feeling better, he is having urinary output.  He still has about 500 cc of fluid to infuse.  However he feels like he will be able to go home then.  Final Clinical Impressions(s) / ED Diagnoses   Final diagnoses:   Heat exhaustion, initial encounter  Non-intractable vomiting with nausea, unspecified vomiting type  Dehydration    ED Discharge Orders        Ordered    promethazine (PHENERGAN) 25 MG tablet  Every 6 hours PRN     07/17/17 0515      Plan discharge  Devoria Albe, MD, Concha Pyo, MD 07/17/17 [phone]

## 2017-07-17 NOTE — Discharge Instructions (Addendum)
Try to drink sports drinks when you are working outside in the heat. Also you will need extra because you donate plasma. Use the phenergan for nausea if it returns. Recheck if you get worse.

## 2018-01-30 ENCOUNTER — Encounter (HOSPITAL_COMMUNITY): Payer: Self-pay | Admitting: Emergency Medicine

## 2018-01-30 ENCOUNTER — Emergency Department (HOSPITAL_COMMUNITY)
Admission: EM | Admit: 2018-01-30 | Discharge: 2018-01-30 | Disposition: A | Discharge status: Home / Self Care | Payer: Self-pay | Attending: Emergency Medicine | Admitting: Emergency Medicine

## 2018-01-30 ENCOUNTER — Other Ambulatory Visit: Payer: Self-pay

## 2018-01-30 DIAGNOSIS — Z79899 Other long term (current) drug therapy: Secondary | ICD-10-CM | POA: Insufficient documentation

## 2018-01-30 DIAGNOSIS — R197 Diarrhea, unspecified: Secondary | ICD-10-CM | POA: Insufficient documentation

## 2018-01-30 DIAGNOSIS — F17200 Nicotine dependence, unspecified, uncomplicated: Secondary | ICD-10-CM | POA: Insufficient documentation

## 2018-01-30 DIAGNOSIS — R112 Nausea with vomiting, unspecified: Secondary | ICD-10-CM | POA: Insufficient documentation

## 2018-01-30 LAB — URINALYSIS, ROUTINE W REFLEX MICROSCOPIC
Bilirubin Urine: NEGATIVE
Glucose, UA: NEGATIVE mg/dL
Hgb urine dipstick: NEGATIVE
Ketones, ur: NEGATIVE mg/dL
LEUKOCYTES UA: NEGATIVE
Nitrite: NEGATIVE
PROTEIN: NEGATIVE mg/dL
SPECIFIC GRAVITY, URINE: 1.029 (ref 1.005–1.030)
pH: 5 (ref 5.0–8.0)

## 2018-01-30 LAB — LIPASE, BLOOD: Lipase: 27 U/L (ref 11–51)

## 2018-01-30 LAB — COMPREHENSIVE METABOLIC PANEL
ALBUMIN: 4.1 g/dL (ref 3.5–5.0)
ALK PHOS: 69 U/L (ref 38–126)
ALT: 42 U/L (ref 0–44)
ANION GAP: 9 (ref 5–15)
AST: 23 U/L (ref 15–41)
BILIRUBIN TOTAL: 1 mg/dL (ref 0.3–1.2)
BUN: 13 mg/dL (ref 6–20)
CALCIUM: 8.8 mg/dL — AB (ref 8.9–10.3)
CO2: 24 mmol/L (ref 22–32)
Chloride: 103 mmol/L (ref 98–111)
Creatinine, Ser: 0.85 mg/dL (ref 0.61–1.24)
GFR calc Af Amer: >60 mL/min (ref >60)
GFR calc non Af Amer: >60 mL/min (ref >60)
GLUCOSE: 109 mg/dL — AB (ref 70–99)
POTASSIUM: 3.8 mmol/L (ref 3.5–5.1)
SODIUM: 136 mmol/L (ref 135–145)
TOTAL PROTEIN: 8.3 g/dL — AB (ref 6.5–8.1)

## 2018-01-30 LAB — CBC
HCT: 44.7 % (ref 39.0–52.0)
HEMOGLOBIN: 14.1 g/dL (ref 13.0–17.0)
MCH: 28.4 pg (ref 26.0–34.0)
MCHC: 31.5 g/dL (ref 30.0–36.0)
MCV: 90.1 fL (ref 80.0–100.0)
NRBC: 0 % (ref 0.0–0.2)
PLATELETS: 183 10*3/uL (ref 150–400)
RBC: 4.96 MIL/uL (ref 4.22–5.81)
RDW: 13.8 % (ref 11.5–15.5)
WBC: 8.2 10*3/uL (ref 4.0–10.5)

## 2018-01-30 MED ORDER — SODIUM CHLORIDE 0.9 % IV BOLUS
2000.0000 mL | Freq: Once | INTRAVENOUS | Status: AC
  Administered 2018-01-30: 2000 mL via INTRAVENOUS

## 2018-01-30 MED ORDER — ONDANSETRON HCL 4 MG/2ML IJ SOLN
4.0000 mg | Freq: Once | INTRAMUSCULAR | Status: AC
  Administered 2018-01-30: 4 mg via INTRAVENOUS
  Filled 2018-01-30: qty 2

## 2018-01-30 MED ORDER — ONDANSETRON 4 MG PO TBDP: ORAL_TABLET | ORAL | 0 refills | Status: DC

## 2018-01-30 MED ORDER — KETOROLAC TROMETHAMINE 30 MG/ML IJ SOLN
30.0000 mg | Freq: Once | INTRAMUSCULAR | Status: AC
  Administered 2018-01-30: 30 mg via INTRAVENOUS
  Filled 2018-01-30: qty 1

## 2018-01-30 MED ORDER — DICYCLOMINE HCL 20 MG PO TABS: ORAL_TABLET | ORAL | 0 refills | Status: DC

## 2018-01-30 NOTE — ED Triage Notes (Signed)
Patient complaining of abdominal pain, vomiting, diarrhea, body aches since yesterday.

## 2018-01-30 NOTE — Discharge Instructions (Addendum)
Take Imodium as needed for diarrhea drink plenty of fluids rest at home today and tomorrow and follow-up with your family doctor if not improving by the end of the week

## 2018-01-30 NOTE — ED Provider Notes (Signed)
[facility] EMERGENCY DEPARTMENT Provider Note   CSN: [id number] Arrival date & time: 01/30/18  [id number]     History   Chief Complaint Chief Complaint  Patient presents with  . Emesis    HPI Bruce Nguyen is a 32 y.o. male.  Patient complains of vomiting and diarrhea and abdominal cramping  The history is provided by the patient. No language interpreter was used.  Emesis   This is a new problem. The current episode started 2 days ago. The problem occurs 2 to 4 times per day. The problem has not changed since onset.The emesis has an appearance of stomach contents. There has been no fever. Associated symptoms include diarrhea. Pertinent negatives include no abdominal pain, no chills, no cough and no headaches.    Past Medical History:  Diagnosis Date  . ADHD   . Asthma   . Enlarged heart   . OCD (obsessive compulsive disorder)     There are no active problems to display for this patient.   Past Surgical History:  Procedure Laterality Date  . COLONOSCOPY          Home Medications    Prior to Admission medications   Medication Sig Start Date End Date Taking? Authorizing Provider  acetaminophen (TYLENOL) 500 MG tablet Take 1,000 mg by mouth every 6 (six) hours as needed for mild pain or headache.   Yes [provider]  albuterol (PROVENTIL HFA;VENTOLIN HFA) 108 (90 Base) MCG/ACT inhaler Inhale 1-2 puffs into the lungs every 6 (six) hours as needed for wheezing or shortness of breath.   Yes [provider]  albuterol (PROVENTIL) (2.5 MG/3ML) 0.083% nebulizer solution Take 3 mLs (2.5 mg total) by nebulization every 4 (four) hours as needed for wheezing or shortness of breath. 04/18/17  Yes Bethann BerkshireZammit, Aadon Gorelik, MD  ibuprofen (ADVIL,MOTRIN) 200 MG tablet Take 400 mg by mouth every 6 (six) hours as needed.   Yes [provider]  dicyclomine (BENTYL) 20 MG tablet Take 1 every 6-8 hours as needed for abdominal cramping 01/30/18   Bethann BerkshireZammit, Kapena Hamme, MD    ondansetron (ZOFRAN ODT) 4 MG disintegrating tablet 4mg  ODT q4 hours prn nausea/vomit 01/30/18   Bethann BerkshireZammit, Mary-Anne Polizzi, MD    Family History Family History  Problem Relation Age of Onset  . Diabetes Father   . Diabetes Other   . Thyroid disease Sister     Social History Social History   Tobacco Use  . Smoking status: Light Tobacco Smoker  . Smokeless tobacco: Never Used  Substance Use Topics  . Alcohol use: Yes    Comment: occas  . Drug use: No     Allergies   Iodine   Review of Systems Review of Systems  Constitutional: Negative for appetite change, chills and fatigue.  HENT: Negative for congestion, ear discharge and sinus pressure.   Eyes: Negative for discharge.  Respiratory: Negative for cough.   Cardiovascular: Negative for chest pain.  Gastrointestinal: Positive for diarrhea and vomiting. Negative for abdominal pain.  Genitourinary: Negative for frequency and hematuria.  Musculoskeletal: Negative for back pain.  Skin: Negative for rash.  Neurological: Negative for seizures and headaches.  Psychiatric/Behavioral: Negative for hallucinations.     Physical Exam Updated Vital Signs BP (!) 145/70 (BP Location: Right Arm)   Pulse 97   Temp 98.6 F (37 C) (Oral)   Resp 18   Ht 6\' 2"  (1.88 m)   Wt (!) 142.4 kg   SpO2 98%   BMI 40.31 kg/m   Physical Exam  Constitutional: He is oriented to person, place, and time. He appears well-developed.  HENT:  Head: Normocephalic.  Eyes: Conjunctivae and EOM are normal. No scleral icterus.  Neck: Neck supple. No thyromegaly present.  Cardiovascular: Normal rate and regular rhythm. Exam reveals no gallop and no friction rub.  No murmur heard. Pulmonary/Chest: No stridor. He has no wheezes. He has no rales. He exhibits no tenderness.  Abdominal: He exhibits no distension. There is tenderness. There is no rebound.  Mild tenderness throughout  Musculoskeletal: Normal range of motion. He exhibits no edema.  Lymphadenopathy:     He has no cervical adenopathy.  Neurological: He is oriented to person, place, and time. He exhibits normal muscle tone. Coordination normal.  Skin: No rash noted. No erythema.  Psychiatric: He has a normal mood and affect. His behavior is normal.     ED Treatments / Results  Labs (all labs ordered are listed, but only abnormal results are displayed) Labs Reviewed  COMPREHENSIVE METABOLIC PANEL - Abnormal; Notable for the following components:      Result Value   Glucose, Bld 109 (*)    Calcium 8.8 (*)    Total Protein 8.3 (*)    All other components within normal limits  LIPASE, BLOOD  CBC  URINALYSIS, ROUTINE W REFLEX MICROSCOPIC    EKG None  Radiology No results found.  Procedures Procedures (including critical care time)  Medications Ordered in ED Medications  sodium chloride 0.9 % bolus 2,000 mL (2,000 mLs Intravenous New Bag/Given 01/30/18 1225)  ketorolac (TORADOL) 30 MG/ML injection 30 mg (30 mg Intravenous Given 01/30/18 1226)  ondansetron (ZOFRAN) injection 4 mg (4 mg Intravenous Given 01/30/18 1227)     Initial Impression / Assessment and Plan / ED Course  I have reviewed the triage vital signs and the nursing notes.  Pertinent labs & imaging results that were available during my care of the patient were reviewed by me and considered in my medical decision making (see chart for details).     Unremarkable.  Patient improved with IV fluids.  Diagnosis gastroenteritis with dehydration.  Patient is given Zofran and Bentyl and is told to take Imodium if necessary  Final Clinical Impressions(s) / ED Diagnoses   Final diagnoses:  Nausea vomiting and diarrhea    ED Discharge Orders         Ordered    ondansetron (ZOFRAN ODT) 4 MG disintegrating tablet     01/30/18 1513    dicyclomine (BENTYL) 20 MG tablet     01/30/18 1513           Bethann Berkshire, MD 01/30/18 1521

## 2018-01-30 NOTE — ED Notes (Signed)
Pt was informed that we need a urine sample. Pt states that he can not urinate at this time. 

## 2018-01-30 NOTE — ED Notes (Signed)
Family at bedside. 

## 2018-02-21 IMAGING — DX DG CHEST 2V
2 series · 2 of 2 positions shown · non-contrast
Comparison: None

CLINICAL DATA: Worsening lower LEFT rib pain and shortness of
breath for 1 week, dry cough today, history asthma, cardiomegaly

EXAM:
CHEST  2 VIEW

[chest pa]
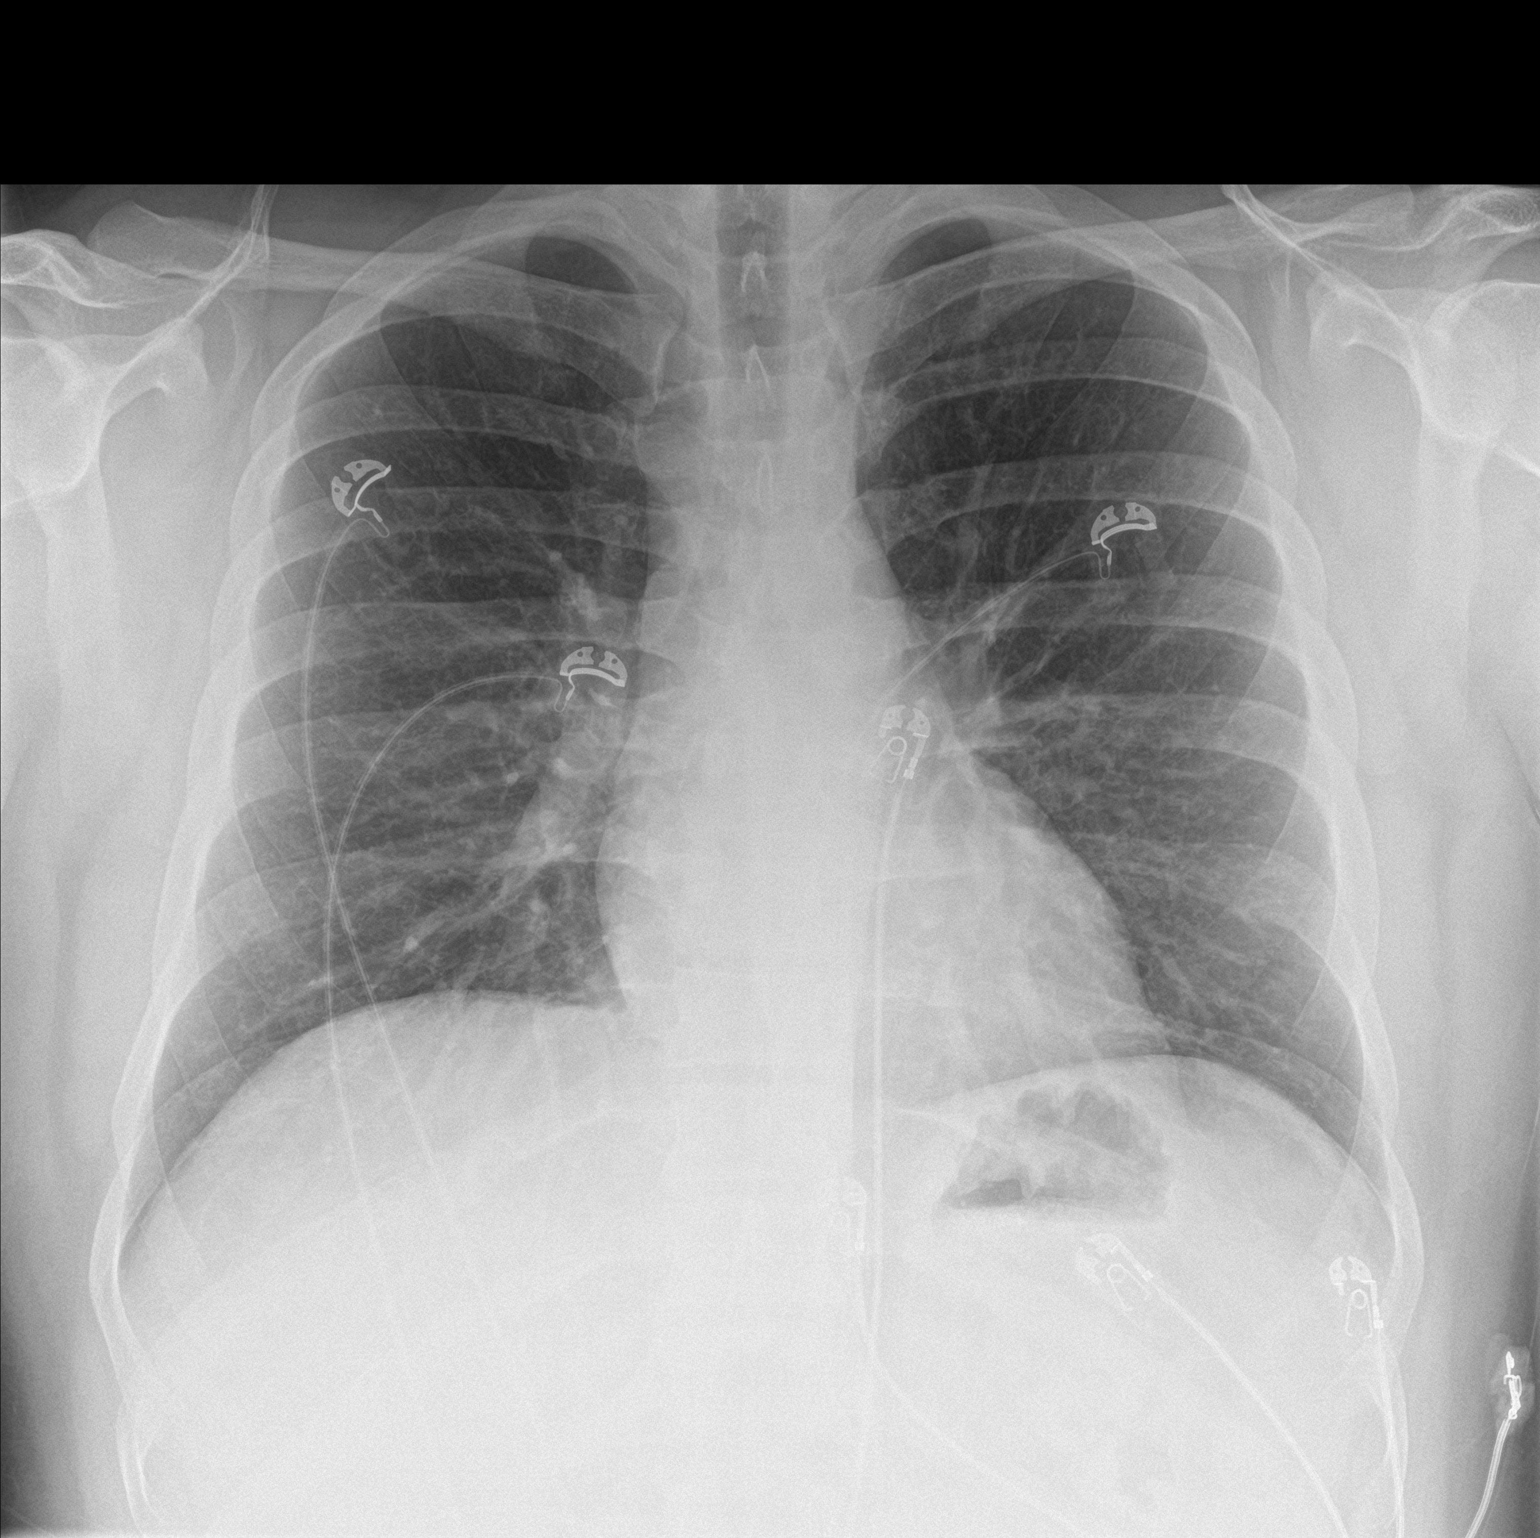

[chest lat]
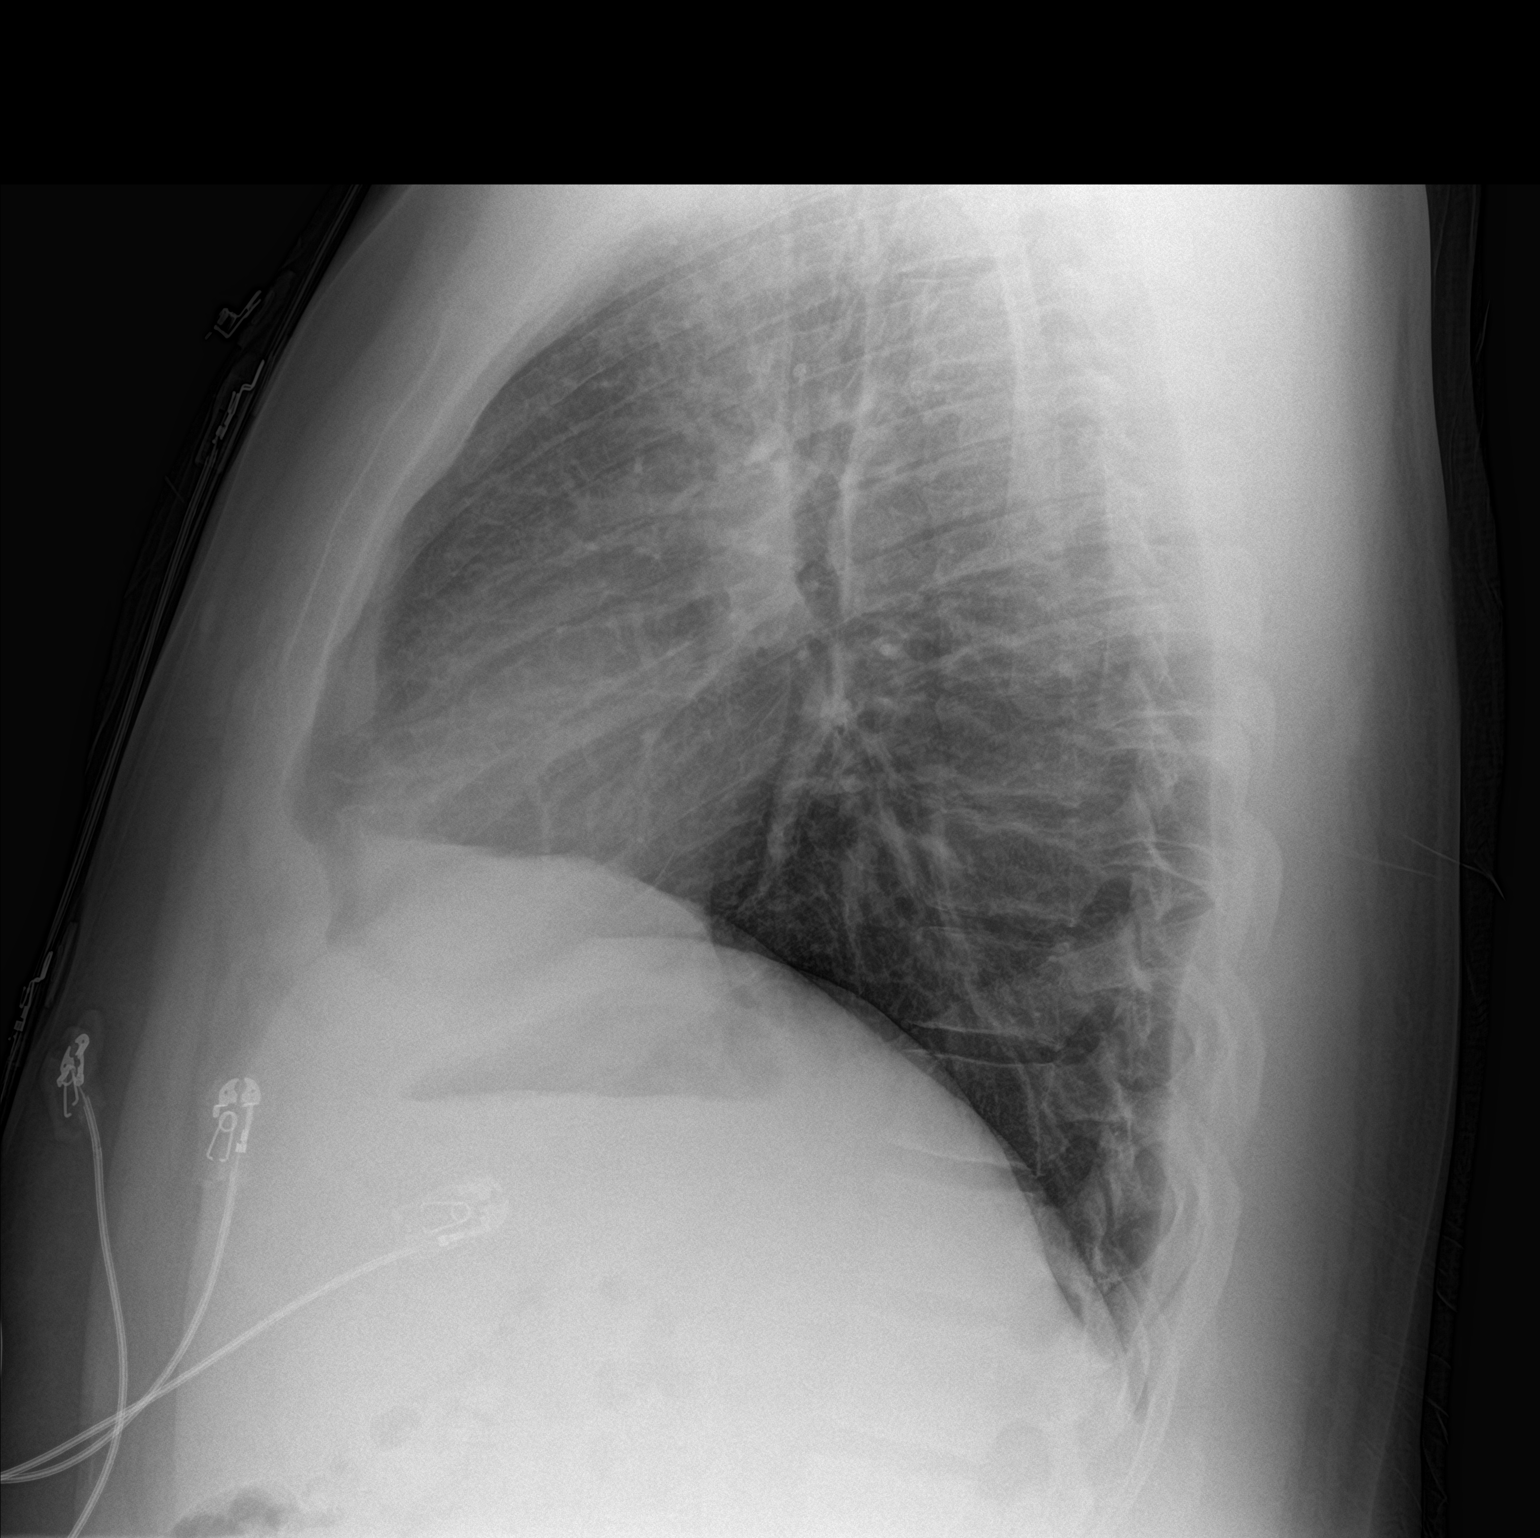

[2 of 2 positions shown; findings below may reference images not displayed]

FINDINGS: Normal heart size, mediastinal contours, and pulmonary vascularity.

Lungs clear.

No pleural effusion or pneumothorax.

Bones unremarkable.
IMPRESSION: Normal exam.

## 2018-11-26 IMAGING — DX DG CHEST 2V
2 series · 2 of 2 positions shown · non-contrast
Comparison: PA and lateral chest 07/14/2016.

CLINICAL DATA: Cough and shortness of breath since last night.

EXAM:
CHEST  2 VIEW

[chest pa]
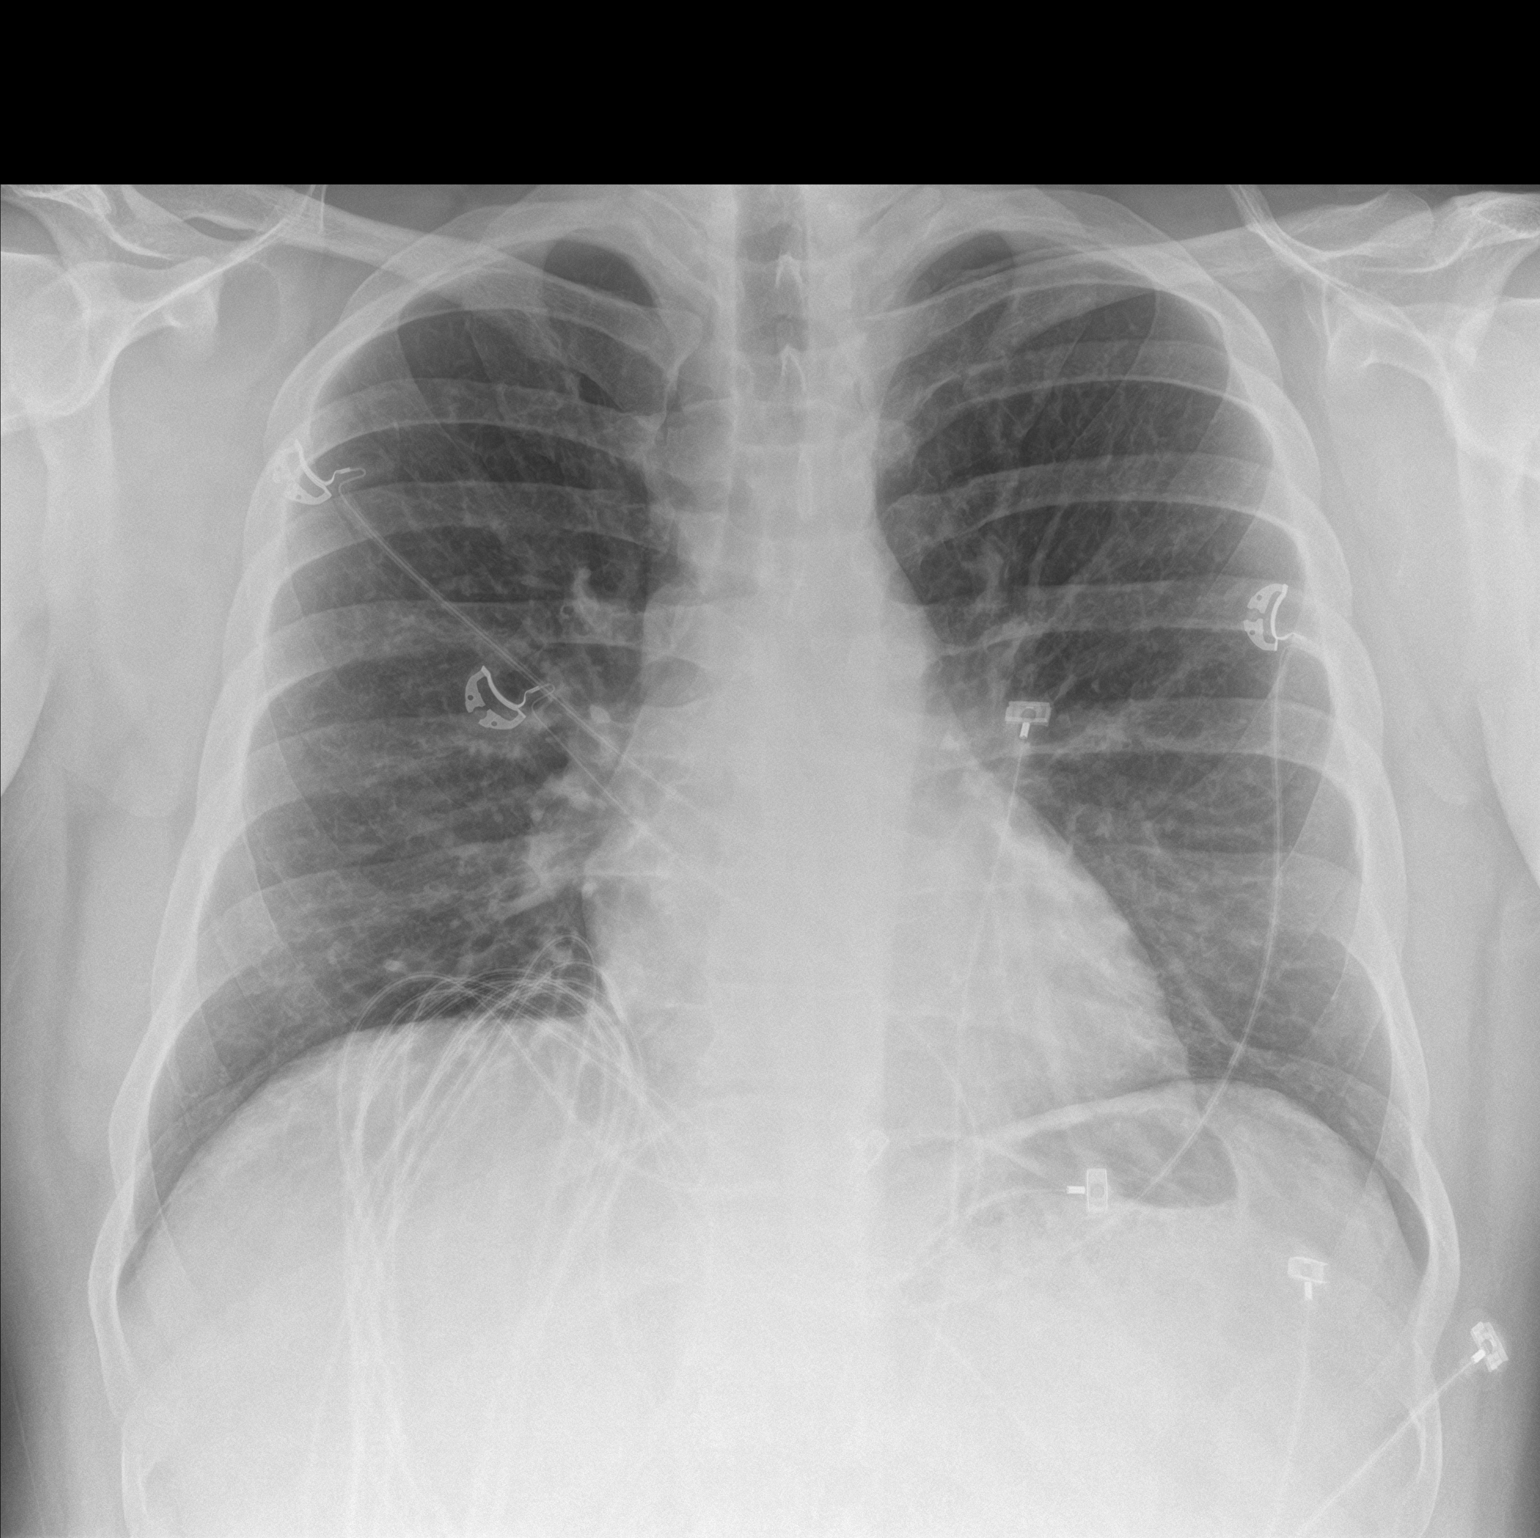

[chest lat]
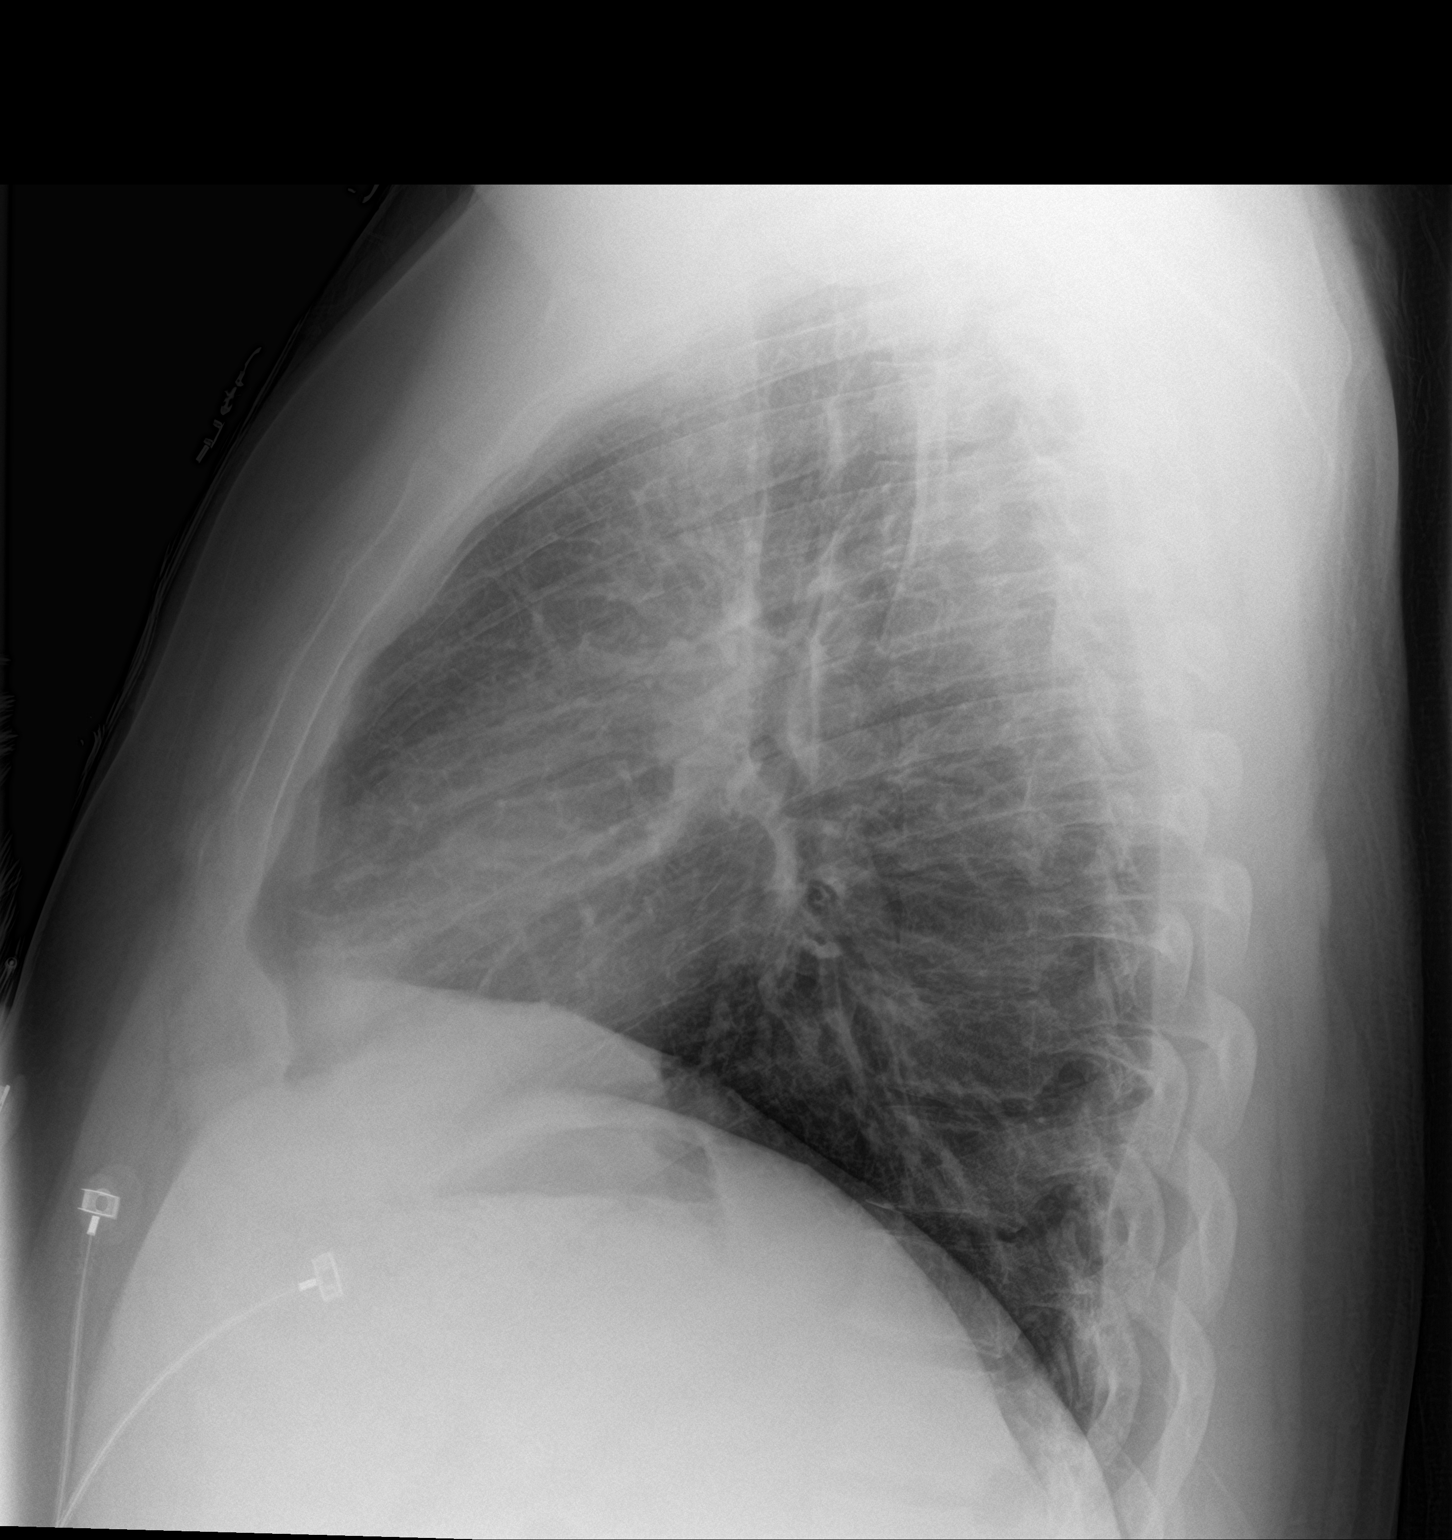

[2 of 2 positions shown; findings below may reference images not displayed]

FINDINGS: The lungs are clear. Heart size is normal. No pneumothorax or
pleural fluid. No acute bony abnormality.
IMPRESSION: Negative chest.

## 2019-07-19 ENCOUNTER — Emergency Department (HOSPITAL_COMMUNITY)
Admission: EM | Admit: 2019-07-19 | Discharge: 2019-07-19 | Disposition: A | Discharge status: Home / Self Care | Payer: Self-pay

## 2022-05-26 DIAGNOSIS — R7303 Prediabetes: Secondary | ICD-10-CM | POA: Diagnosis not present

## 2022-05-26 DIAGNOSIS — E785 Hyperlipidemia, unspecified: Secondary | ICD-10-CM | POA: Diagnosis not present

## 2022-05-26 DIAGNOSIS — R208 Other disturbances of skin sensation: Secondary | ICD-10-CM | POA: Diagnosis not present

## 2022-05-26 DIAGNOSIS — K219 Gastro-esophageal reflux disease without esophagitis: Secondary | ICD-10-CM | POA: Diagnosis not present

## 2022-05-26 DIAGNOSIS — Z79899 Other long term (current) drug therapy: Secondary | ICD-10-CM | POA: Diagnosis not present

## 2022-06-28 DIAGNOSIS — G4733 Obstructive sleep apnea (adult) (pediatric): Secondary | ICD-10-CM | POA: Diagnosis not present

## 2022-07-29 DIAGNOSIS — G4733 Obstructive sleep apnea (adult) (pediatric): Secondary | ICD-10-CM | POA: Diagnosis not present

## 2022-08-28 DIAGNOSIS — K429 Umbilical hernia without obstruction or gangrene: Secondary | ICD-10-CM | POA: Diagnosis not present

## 2022-08-28 DIAGNOSIS — R1033 Periumbilical pain: Secondary | ICD-10-CM | POA: Diagnosis not present

## 2022-08-28 DIAGNOSIS — G4733 Obstructive sleep apnea (adult) (pediatric): Secondary | ICD-10-CM | POA: Diagnosis not present

## 2022-08-31 DIAGNOSIS — K219 Gastro-esophageal reflux disease without esophagitis: Secondary | ICD-10-CM | POA: Diagnosis not present

## 2022-08-31 DIAGNOSIS — R7303 Prediabetes: Secondary | ICD-10-CM | POA: Diagnosis not present

## 2022-08-31 DIAGNOSIS — E785 Hyperlipidemia, unspecified: Secondary | ICD-10-CM | POA: Diagnosis not present

## 2022-09-01 DIAGNOSIS — Z79899 Other long term (current) drug therapy: Secondary | ICD-10-CM | POA: Diagnosis not present

## 2022-09-01 DIAGNOSIS — E559 Vitamin D deficiency, unspecified: Secondary | ICD-10-CM | POA: Diagnosis not present

## 2022-09-01 DIAGNOSIS — E785 Hyperlipidemia, unspecified: Secondary | ICD-10-CM | POA: Diagnosis not present

## 2022-09-23 ENCOUNTER — Ambulatory Visit: Payer: BC Managed Care – PPO | Admitting: Family Medicine

## 2022-09-28 DIAGNOSIS — G4733 Obstructive sleep apnea (adult) (pediatric): Secondary | ICD-10-CM | POA: Diagnosis not present

## 2022-10-26 ENCOUNTER — Ambulatory Visit: Payer: BC Managed Care – PPO | Admitting: Family Medicine

## 2022-10-27 ENCOUNTER — Encounter: Payer: Self-pay | Admitting: Family Medicine

## 2022-10-29 DIAGNOSIS — G4733 Obstructive sleep apnea (adult) (pediatric): Secondary | ICD-10-CM | POA: Diagnosis not present

## 2022-11-28 DIAGNOSIS — G4733 Obstructive sleep apnea (adult) (pediatric): Secondary | ICD-10-CM | POA: Diagnosis not present

## 2022-12-06 ENCOUNTER — Encounter: Payer: Self-pay | Admitting: Family Medicine

## 2022-12-06 ENCOUNTER — Ambulatory Visit (INDEPENDENT_AMBULATORY_CARE_PROVIDER_SITE_OTHER): Payer: BC Managed Care – PPO | Admitting: Family Medicine

## 2022-12-06 VITALS — BP 128/78 | HR 79 | Ht 73.0 in | Wt 340.1 lb

## 2022-12-06 DIAGNOSIS — Z23 Encounter for immunization: Secondary | ICD-10-CM

## 2022-12-06 DIAGNOSIS — Z1159 Encounter for screening for other viral diseases: Secondary | ICD-10-CM

## 2022-12-06 DIAGNOSIS — E785 Hyperlipidemia, unspecified: Secondary | ICD-10-CM | POA: Diagnosis not present

## 2022-12-06 DIAGNOSIS — I83813 Varicose veins of bilateral lower extremities with pain: Secondary | ICD-10-CM | POA: Diagnosis not present

## 2022-12-06 DIAGNOSIS — R7301 Impaired fasting glucose: Secondary | ICD-10-CM

## 2022-12-06 DIAGNOSIS — E038 Other specified hypothyroidism: Secondary | ICD-10-CM

## 2022-12-06 DIAGNOSIS — M545 Low back pain, unspecified: Secondary | ICD-10-CM

## 2022-12-06 DIAGNOSIS — I10 Essential (primary) hypertension: Secondary | ICD-10-CM

## 2022-12-06 DIAGNOSIS — E559 Vitamin D deficiency, unspecified: Secondary | ICD-10-CM

## 2022-12-06 DIAGNOSIS — I83819 Varicose veins of unspecified lower extremities with pain: Secondary | ICD-10-CM | POA: Insufficient documentation

## 2022-12-06 DIAGNOSIS — Z114 Encounter for screening for human immunodeficiency virus [HIV]: Secondary | ICD-10-CM

## 2022-12-06 MED ORDER — CYCLOBENZAPRINE HCL 5 MG PO TABS: 5.0000 mg | ORAL_TABLET | Freq: Every evening | ORAL | 1 refills | Status: DC | PRN

## 2022-12-06 NOTE — Assessment & Plan Note (Signed)
I recommended taking Flexeril 5 mg at bedtime to help with muscle relaxation.  Encouraged to continue using over-the-counter analgesic for pain relief. Encouraged to review the  attached lumbar exercises to perform for better management of your condition. Recommended supportive care with heat and cold therapy, maintaining a healthy weight, and increasing his physical activity

## 2022-12-06 NOTE — Assessment & Plan Note (Addendum)
I recommend decreasing his intake of greasy, fatty, and starchy foods while increasing physical activity. I informed the patient to let me know the name of the cholesterol medication he was previously taking so that adjustments can be made as needed. Pending lipid panel today

## 2022-12-06 NOTE — Patient Instructions (Addendum)
I appreciate the opportunity to provide care to you today!    Follow up:  3 months  Labs: please stop by the lab today to get your blood drawn (CBC, CMP, TSH, Lipid profile, HgA1c, Vit D)  Screening: HIV and Hep C  Varicose Veins: I recommend daily elevation of the legs for 20-30 minutes above the level of the heart. Additionally, daily use of compression stockings is advised, along with regular exercise and weight reduction. It's also important to refrain from prolonged sitting or standing to alleviate symptoms.  Lumbar Back Pain: I recommend taking Flexeril 5 mg at bedtime to help with muscle relaxation. You may continue using over-the-counter analgesic for pain relief. Attached are your AVS (After Visit Summary) materials, which include lumbar exercises to perform for better management of your condition.  Here are several nonpharmacological interventions for managing back pain: -Physical Activity: Engage in regular low-impact exercises such as walking, swimming, or cycling to strengthen the back muscles and improve flexibility. -Stretching and Strengthening Exercises: Incorporate exercises that focus on strengthening the core and back muscles, as well as stretches to improve flexibility. Specific exercises can include: Cat-Cow stretch Child's pose Knee-to-chest stretch Hamstring stretches Hot and Cold Therapy: -Cold Therapy: Apply ice packs to the affected area for 15-20 minutes to reduce inflammation and numb the pain, especially within the first 48 hours of an injury. -Heat Therapy: After the initial inflammation has subsided, use heat packs or warm baths to relax tight muscles and improve circulation. -Posture Improvement: Maintain proper posture while sitting, standing, and lifting to avoid additional strain on the back. Ergonomic chairs and lumbar support can help. -Weight Management: Maintain a healthy weight to reduce stress on the spine and lower back. -Sleep Hygiene: Ensure  proper sleep positioning by using supportive pillows and mattresses to maintain spinal alignment during sleep.  Referrals today- vascular surgery  Attached with your AVS, you will find valuable resources for self-education. I highly recommend dedicating some time to thoroughly examine them.   Please continue to a heart-healthy diet and increase your physical activities. Try to exercise for at least five days a week.    It was a pleasure to see you and I look forward to continuing to work together on your health and well-being. Please do not hesitate to call the office if you need care or have questions about your care.  In case of emergency, please visit the Emergency Department for urgent care, or contact our clinic at [phone] to schedule an appointment. We're here to help you!   Have a wonderful day and week. With Gratitude, Gilmore Laroche MSN, FNP-BC

## 2022-12-06 NOTE — Assessment & Plan Note (Addendum)
Controlled Low-sodium diet with increased physical activity encouraged I informed the patient to let me know the name of the BP medication he was previously taking  BP Readings from Last 3 Encounters:  12/06/22 128/78  01/30/18 (!) 145/70  07/17/17 (!) 119/54

## 2022-12-06 NOTE — Progress Notes (Signed)
New Patient Office Visit  Subjective:  Patient ID: Bruce Nguyen, male    DOB: [birth year]  Age: 38 y.o. MRN: [id number]  CC:  Chief Complaint  Patient presents with   New Patient (Initial Visit)    Establishing care. Previously seen by [facility], needs medication refills. Pt reports pain in back, legs, shoulders, etc.     HPI Bruce Nguyen is a 37 y.o. male with past medical history of lumbar back pain, hypertension, hyperlipidemia presents for establishing care.  Hypertension: The patient reports that he has been on blood pressure medication but has not taken it for the past month. He is unable to recall the name of the medication. Currently, he is asymptomatic in the clinic.  Hyperlipidemia: The patient mentions being on a cholesterol medication, which he has also not taken for a month, and he does not remember the name of this medication.  Lumbar Back Pain: The patient has a chronic condition and has previously received physical therapy, which he found very helpful in managing his symptoms. He declines a referral for physical therapy today, citing his demanding work schedule of six 12-hour shifts, which makes attendance difficult. His job involves prolonged standing, bending, and lifting. He rates his current pain level as 2 out of 10, and it is non-radiating. No recent trauma or injury has been noted. The patient's BMI is 44.87.  Varicose Veins: The patient noticed varicose veins of his lower extremity bilaterally after starting his new job about a year ago. He reports pain with palpation but states that compliance with his compression stockings has helped relieve his symptoms. He works in a position that requires prolonged standing.   Past Medical History:  Diagnosis Date   ADHD    Asthma    Enlarged heart    OCD (obsessive compulsive disorder)     Past Surgical History:  Procedure Laterality Date   COLONOSCOPY      Family History  Problem Relation  Age of Onset   Diabetes Father    Diabetes Other    Thyroid disease Sister     Social History   Socioeconomic History   Marital status: Married    Spouse name: Not on file   Number of children: Not on file   Years of education: Not on file   Highest education level: Not on file  Occupational History   Not on file  Tobacco Use   Smoking status: Light Smoker   Smokeless tobacco: Never   Tobacco comments:    Every now and then  Vaping Use   Vaping status: Former  Substance and Sexual Activity   Alcohol use: Yes    Comment: social   Drug use: No   Sexual activity: Not on file  Other Topics Concern   Not on file  Social History Narrative   Not on file   Social Determinants of Health   Financial Resource Strain: Not on file  Food Insecurity: Not on file  Transportation Needs: Not on file  Physical Activity: Not on file  Stress: Not on file  Social Connections: Not on file  Intimate Partner Violence: Not on file    ROS Review of Systems  Constitutional:  Negative for fatigue and fever.  Eyes:  Negative for visual disturbance.  Respiratory:  Negative for chest tightness and shortness of breath.   Cardiovascular:  Negative for chest pain and palpitations.  Musculoskeletal:  Positive for back pain.  Neurological:  Negative for dizziness and headaches.  Objective:   Today's Vitals: BP 128/78 (BP Location: Left Arm)   Pulse 79   Ht 6\' 1"  (1.854 m)   Wt (!) 340 lb 1.3 oz (154.3 kg)   SpO2 94%   BMI 44.87 kg/m   Physical Exam Vitals reviewed: Varicose veins of the lower extremity.  HENT:     Head: Normocephalic.     Right Ear: External ear normal.     Left Ear: External ear normal.     Nose: No congestion or rhinorrhea.     Mouth/Throat:     Mouth: Mucous membranes are moist.  Cardiovascular:     Rate and Rhythm: Regular rhythm.     Heart sounds: No murmur heard. Pulmonary:     Effort: No respiratory distress.     Breath sounds: Normal breath sounds.   Musculoskeletal:     Lumbar back: No tenderness or bony tenderness. Negative right straight leg raise test and negative left straight leg raise test.     Right lower leg: No edema.     Left lower leg: No edema.  Neurological:     Mental Status: He is alert.     Assessment & Plan:   Primary hypertension Assessment & Plan: Controlled Low-sodium diet with increased physical activity encouraged I informed the patient to let me know the name of the BP medication he was previously taking  BP Readings from Last 3 Encounters:  12/06/22 128/78  01/30/18 (!) 145/70  07/17/17 (!) 119/54      Hyperlipidemia LDL goal <100 Assessment & Plan: I recommend decreasing his intake of greasy, fatty, and starchy foods while increasing physical activity. I informed the patient to let me know the name of the cholesterol medication he was previously taking so that adjustments can be made as needed. Pending lipid panel today  Orders: -     Lipid panel -     CMP14+EGFR -     CBC with Differential/Platelet  Lumbar back pain Assessment & Plan:  I recommended taking Flexeril 5 mg at bedtime to help with muscle relaxation.  Encouraged to continue using over-the-counter analgesic for pain relief. Encouraged to review the  attached lumbar exercises to perform for better management of your condition. Recommended supportive care with heat and cold therapy, maintaining a healthy weight, and increasing his physical activity  Orders: -     Cyclobenzaprine HCl; Take 1 tablet (5 mg total) by mouth at bedtime as needed for muscle spasms.  Dispense: 30 tablet; Refill: 1  Varicose veins of both lower extremities with pain Assessment & Plan: I recommend daily elevation of the legs for 20-30 minutes above the level of the heart. Additionally, daily use of compression stockings is advised, along with regular exercise and weight reduction. It's also important to refrain from prolonged sitting or standing to alleviate  symptoms. Referral placed to vascular surgery for collaborative care  Orders: -     Ambulatory referral to Vascular Surgery  Immunization due -     Tdap vaccine greater than or equal to 7yo IM  IFG (impaired fasting glucose) -     Hemoglobin A1c  Vitamin D deficiency -     VITAMIN D 25 Hydroxy (Vit-D Deficiency, Fractures)  Need for hepatitis C screening test -     Hepatitis C antibody  Encounter for screening for HIV -     HIV Antibody (routine testing w rflx)  TSH (thyroid-stimulating hormone deficiency) -     TSH + free T4   Note: This  chart has been completed using Engineer, civil (consulting) software, and while attempts have been made to ensure accuracy, certain words and phrases may not be transcribed as intended.    Follow-up: Return in about 3 months (around 03/08/2023).   Gilmore Laroche, FNP

## 2022-12-06 NOTE — Assessment & Plan Note (Signed)
I recommend daily elevation of the legs for 20-30 minutes above the level of the heart. Additionally, daily use of compression stockings is advised, along with regular exercise and weight reduction. It's also important to refrain from prolonged sitting or standing to alleviate symptoms. Referral placed to vascular surgery for collaborative care

## 2022-12-07 ENCOUNTER — Other Ambulatory Visit: Payer: Self-pay

## 2022-12-07 ENCOUNTER — Encounter: Payer: Self-pay | Admitting: Family Medicine

## 2022-12-08 ENCOUNTER — Other Ambulatory Visit: Payer: Self-pay | Admitting: *Deleted

## 2022-12-08 DIAGNOSIS — I83813 Varicose veins of bilateral lower extremities with pain: Secondary | ICD-10-CM

## 2022-12-13 ENCOUNTER — Ambulatory Visit (HOSPITAL_COMMUNITY)
Admission: RE | Admit: 2022-12-13 | Discharge: 2022-12-13 | Disposition: A | Discharge status: Home / Self Care | Payer: BC Managed Care – PPO | Source: Ambulatory Visit | Attending: Vascular Surgery | Admitting: Vascular Surgery

## 2022-12-13 DIAGNOSIS — I83813 Varicose veins of bilateral lower extremities with pain: Secondary | ICD-10-CM

## 2022-12-29 DIAGNOSIS — G4733 Obstructive sleep apnea (adult) (pediatric): Secondary | ICD-10-CM | POA: Diagnosis not present

## 2022-12-30 ENCOUNTER — Ambulatory Visit (INDEPENDENT_AMBULATORY_CARE_PROVIDER_SITE_OTHER): Payer: BC Managed Care – PPO | Admitting: Physician Assistant

## 2022-12-30 VITALS — BP 118/83 | HR 82 | Temp 97.3°F | Ht 73.0 in | Wt 341.7 lb

## 2022-12-30 DIAGNOSIS — I83813 Varicose veins of bilateral lower extremities with pain: Secondary | ICD-10-CM | POA: Diagnosis not present

## 2022-12-30 DIAGNOSIS — M7989 Other specified soft tissue disorders: Secondary | ICD-10-CM

## 2022-12-30 DIAGNOSIS — I872 Venous insufficiency (chronic) (peripheral): Secondary | ICD-10-CM

## 2022-12-30 NOTE — Progress Notes (Signed)
Office Note     CC:  follow up Requesting Provider:  Gilmore Laroche, FNP  HPI: Bruce Nguyen is a 37 y.o. (February 22, 1986) male who presents for evaluation of superficial veins of bilateral lower extremities left worse than the right.  Patient states that he has noticed more prominent veins over the past 2 to 3 years.  His job requires him to be on his feet for 12-hour shifts 6 days a week.  He wears knee-high compression socks on a daily basis however over time they have become loose compared to when they were first purchased.  He has not focused on elevating his legs during the day.  He has no history of DVT, venous ulcerations, trauma, or prior vascular interventions.  He is an occasional smoker.  He states he has lost about 20 pounds over the past several months due to his activity level required at work.   Past Medical History:  Diagnosis Date   ADHD    Asthma    Enlarged heart    OCD (obsessive compulsive disorder)     Past Surgical History:  Procedure Laterality Date   COLONOSCOPY      Social History   Socioeconomic History   Marital status: Married    Spouse name: Not on file   Number of children: Not on file   Years of education: Not on file   Highest education level: Not on file  Occupational History   Not on file  Tobacco Use   Smoking status: Light Smoker   Smokeless tobacco: Never   Tobacco comments:    Every now and then  Vaping Use   Vaping status: Former  Substance and Sexual Activity   Alcohol use: Yes    Comment: social   Drug use: No   Sexual activity: Not on file  Other Topics Concern   Not on file  Social History Narrative   Not on file   Social Determinants of Health   Financial Resource Strain: Not on file  Food Insecurity: Not on file  Transportation Needs: Not on file  Physical Activity: Not on file  Stress: Not on file  Social Connections: Not on file  Intimate Partner Violence: Not on file    Family History  Problem Relation  Age of Onset   Diabetes Father    Diabetes Other    Thyroid disease Sister     Current Outpatient Medications  Medication Sig Dispense Refill   acetaminophen (TYLENOL) 500 MG tablet Take 1,000 mg by mouth every 6 (six) hours as needed for mild pain or headache.     albuterol (PROVENTIL HFA;VENTOLIN HFA) 108 (90 Base) MCG/ACT inhaler Inhale 1-2 puffs into the lungs every 6 (six) hours as needed for wheezing or shortness of breath.     albuterol (PROVENTIL) (2.5 MG/3ML) 0.083% nebulizer solution Take 3 mLs (2.5 mg total) by nebulization every 4 (four) hours as needed for wheezing or shortness of breath. 30 vial 1   atorvastatin (LIPITOR) 20 MG tablet Take 20 mg by mouth daily.     cyclobenzaprine (FLEXERIL) 5 MG tablet Take 1 tablet (5 mg total) by mouth at bedtime as needed for muscle spasms. 30 tablet 1   dicyclomine (BENTYL) 20 MG tablet Take 1 every 6-8 hours as needed for abdominal cramping (Patient not taking: Reported on 12/06/2022) 20 tablet 0   ibuprofen (ADVIL,MOTRIN) 200 MG tablet Take 400 mg by mouth every 6 (six) hours as needed. (Patient not taking: Reported on 12/06/2022)  lisinopril (ZESTRIL) 20 MG tablet Take 20 mg by mouth daily.     ondansetron (ZOFRAN ODT) 4 MG disintegrating tablet 4mg  ODT q4 hours prn nausea/vomit (Patient not taking: Reported on 12/06/2022) 12 tablet 0   No current facility-administered medications for this visit.    Allergies  Allergen Reactions   Iodine Itching     REVIEW OF SYSTEMS:   [X]  denotes positive finding, [ ]  denotes negative finding Cardiac  Comments:  Chest pain or chest pressure:    Shortness of breath upon exertion:    Short of breath when lying flat:    Irregular heart rhythm:        Vascular    Pain in calf, thigh, or hip brought on by ambulation:    Pain in feet at night that wakes you up from your sleep:     Blood clot in your veins:    Leg swelling:         Pulmonary    Oxygen at home:    Productive cough:      Wheezing:         Neurologic    Sudden weakness in arms or legs:     Sudden numbness in arms or legs:     Sudden onset of difficulty speaking or slurred speech:    Temporary loss of vision in one eye:     Problems with dizziness:         Gastrointestinal    Blood in stool:     Vomited blood:         Genitourinary    Burning when urinating:     Blood in urine:        Psychiatric    Major depression:         Hematologic    Bleeding problems:    Problems with blood clotting too easily:        Skin    Rashes or ulcers:        Constitutional    Fever or chills:      PHYSICAL EXAMINATION:  Vitals:   12/30/22 1049  BP: 118/83  Pulse: 82  Temp: (!) 97.3 F (36.3 C)  SpO2: 98%  Weight: (!) 341 lb 11.2 oz (155 kg)  Height: 6\' 1"  (1.854 m)    General:  WDWN in NAD; vital signs documented above Gait: Not observed HENT: WNL, normocephalic Pulmonary: normal non-labored breathing , without Rales, rhonchi,  wheezing Cardiac: regular HR Abdomen: soft, NT, no masses Skin: without rashes Vascular Exam/Pulses: Palpable DP pulses Extremities: without ischemic changes, without Gangrene , without cellulitis; without open wounds; spider veins of the left medial thigh, right medial knee, and both medial ankles; no significant edema; no venous ulcerations; no hyperpigmentation Musculoskeletal: no muscle wasting or atrophy  Neurologic: A&O X 3 Psychiatric:  The pt has Normal affect.   Non-Invasive Vascular Imaging:   LEFT          Reflux NoRefluxReflux TimeDiameter cmsComments                          Yes                                   +--------------+---------+------+-----------+------------+--------+  CFV                    yes   >1 second                       +--------------+---------+------+-----------+------------+--------+  FV prox       no                                               +--------------+---------+------+-----------+------------+--------+  FV mid        no                                              +--------------+---------+------+-----------+------------+--------+  FV dist                 yes   >1 second                       +--------------+---------+------+-----------+------------+--------+  Popliteal              yes   >1 second                       +--------------+---------+------+-----------+------------+--------+  GSV at [facility]    no                           0.574              +--------------+---------+------+-----------+------------+--------+  GSV prox thigh                             0.326              +--------------+---------+------+-----------+------------+--------+  GSV mid thigh           yes    >500 ms     0.334              +--------------+---------+------+-----------+------------+--------+  GSV dist thigh          yes    >500 ms     0.306              +--------------+---------+------+-----------+------------+--------+  GSV at knee   no                           0.274              +--------------+---------+------+-----------+------------+--------+  SSV Pop Fossa no                           0.297              +--------------+---------+------+-----------+------------+--------+  SSV prox calf no                           0.169              +--------------+---------+------+-----------+------------+--------+  SSV mid calf  no                           0.201              +--------------+---------+------+-----------+------------+--------+         ASSESSMENT/PLAN:: 37 y.o. male here for evaluation of prominent veins in both legs left worse than right  Mr. Bruce Nguyen is a 37 year old male referred here for evaluation  of superficial veins appearance on both legs.  These are largely asymptomatic.  Left lower extremity venous reflux study was negative for DVT.   He has some areas of deep and superficial venous insufficiency however overall no significant findings were noted.  Recommendations include continued daily use of knee-high compression stockings 15 to 20 mmHg.  He was measured for and purchased several pairs of these today.  He will also make an effort to elevate his legs when he comes home from work.  He will also make an effort to avoid prolonged sitting and standing when possible.  We also discussed weight loss as a way to improve venous return from the legs.  He will follow-up on an as-needed basis.  He knows to call/return to office with any questions or concerns.   Emilie Rutter, PA-C [facility] [phone]  Clinic MD:   Karin Lieu

## 2023-01-28 DIAGNOSIS — G4733 Obstructive sleep apnea (adult) (pediatric): Secondary | ICD-10-CM | POA: Diagnosis not present

## 2023-02-28 DIAGNOSIS — G4733 Obstructive sleep apnea (adult) (pediatric): Secondary | ICD-10-CM | POA: Diagnosis not present

## 2023-03-08 ENCOUNTER — Ambulatory Visit (HOSPITAL_COMMUNITY)
Admission: RE | Admit: 2023-03-08 | Discharge: 2023-03-08 | Disposition: A | Discharge status: Home / Self Care | Payer: BC Managed Care – PPO | Source: Ambulatory Visit | Attending: Family Medicine | Admitting: Family Medicine

## 2023-03-08 ENCOUNTER — Ambulatory Visit (INDEPENDENT_AMBULATORY_CARE_PROVIDER_SITE_OTHER): Payer: BC Managed Care – PPO | Admitting: Family Medicine

## 2023-03-08 ENCOUNTER — Encounter: Payer: Self-pay | Admitting: Family Medicine

## 2023-03-08 VITALS — BP 132/89 | HR 71 | Ht 73.0 in | Wt 342.1 lb

## 2023-03-08 DIAGNOSIS — E785 Hyperlipidemia, unspecified: Secondary | ICD-10-CM | POA: Diagnosis not present

## 2023-03-08 DIAGNOSIS — G8929 Other chronic pain: Secondary | ICD-10-CM | POA: Insufficient documentation

## 2023-03-08 DIAGNOSIS — M25512 Pain in left shoulder: Secondary | ICD-10-CM | POA: Diagnosis not present

## 2023-03-08 DIAGNOSIS — I10 Essential (primary) hypertension: Secondary | ICD-10-CM | POA: Diagnosis not present

## 2023-03-08 DIAGNOSIS — Z1159 Encounter for screening for other viral diseases: Secondary | ICD-10-CM | POA: Diagnosis not present

## 2023-03-08 DIAGNOSIS — R7301 Impaired fasting glucose: Secondary | ICD-10-CM | POA: Diagnosis not present

## 2023-03-08 DIAGNOSIS — M545 Low back pain, unspecified: Secondary | ICD-10-CM | POA: Diagnosis not present

## 2023-03-08 DIAGNOSIS — E559 Vitamin D deficiency, unspecified: Secondary | ICD-10-CM | POA: Diagnosis not present

## 2023-03-08 DIAGNOSIS — E7849 Other hyperlipidemia: Secondary | ICD-10-CM | POA: Diagnosis not present

## 2023-03-08 DIAGNOSIS — Z114 Encounter for screening for human immunodeficiency virus [HIV]: Secondary | ICD-10-CM | POA: Diagnosis not present

## 2023-03-08 DIAGNOSIS — E038 Other specified hypothyroidism: Secondary | ICD-10-CM | POA: Diagnosis not present

## 2023-03-08 MED ORDER — ACETAMINOPHEN 500 MG PO TABS: 1000.0000 mg | ORAL_TABLET | Freq: Four times a day (QID) | ORAL | 1 refills | Status: DC | PRN

## 2023-03-08 MED ORDER — METHOCARBAMOL 500 MG PO TABS: 500.0000 mg | ORAL_TABLET | Freq: Three times a day (TID) | ORAL | 1 refills | Status: DC

## 2023-03-08 NOTE — Assessment & Plan Note (Signed)
 The patient reports sustaining an injury to his left shoulder while snowboarding at age 38. After the injury, he followed up with physical therapy, which helped improve flexibility and reduce pain. The patient reports wanting to resume physical therapy, as he has been experiencing increasing stiffness in the left shoulder, with no recent injury or trauma reported. No weakness, numbness, tingling, or limited range of motion noted. A referral has been placed to physical therapy today.

## 2023-03-08 NOTE — Assessment & Plan Note (Signed)
 An imaging study will be obtained to rule out underlying pathology. A referral has been placed for physical therapy. The patient is encouraged to maintain a healthy weight and avoid aggravating activities such as bending and lifting heavy objects. However, this is challenging for the patient, as their job requires heavy lifting and bending. Heat or cold applications are encouraged for lower back pain relief. No red flag symptoms for back pain were reported today. Therapy will be initiated with Robaxin  and Tylenol  for pain relief.

## 2023-03-08 NOTE — Assessment & Plan Note (Signed)
 He reports taking his cholesterol medication, atorvastatin 20 mg, every other day rather than daily as prescribed. He is encouraged to take atorvastatin 20 mg daily as prescribed.  The patient was encouraged to make lifestyle changes, including avoiding simple carbohydrates such as cakes, sweet desserts, ice cream, soda (diet or regular), sweet tea, candies, chips, cookies, store-bought juices, excessive alcohol (more than 1-2 drinks per day), lemonade, artificial sweeteners, donuts, coffee creamers, and sugar-free products. Additionally, reducing the consumption of greasy, fatty foods and increasing physical activity were recommended. The patient verbalized understanding and is aware of the plan of care. .lastlip

## 2023-03-08 NOTE — Progress Notes (Signed)
 Established Patient Office Visit  Subjective:  Patient ID: Bruce Nguyen, male    DOB: 05-21-1985  Age: 38 y.o. MRN: [id number]  CC:  Chief Complaint  Patient presents with   Follow-up    3 mo f/u for HTN, HDL, Lumbar Pain Shoulder and back pain, requesting restart of PT    HPI Bruce Nguyen is a 38 y.o. male with past medical history of chronic lumbar pain, chronic left shoulder pain, hypertension, hyperlipidemia presents for f/u of  chronic medical conditions. For the details of today's visit, please refer to the assessment and plan.     Past Medical History:  Diagnosis Date   ADHD    Asthma    Enlarged heart    OCD (obsessive compulsive disorder)     Past Surgical History:  Procedure Laterality Date   COLONOSCOPY      Family History  Problem Relation Age of Onset   Diabetes Father    Diabetes Other    Thyroid disease Sister     Social History   Socioeconomic History   Marital status: Married    Spouse name: Not on file   Number of children: Not on file   Years of education: Not on file   Highest education level: Associate degree: occupational, scientist, product/process development, or vocational program  Occupational History   Not on file  Tobacco Use   Smoking status: Light Smoker   Smokeless tobacco: Never   Tobacco comments:    Every now and then  Vaping Use   Vaping status: Former  Substance and Sexual Activity   Alcohol use: Yes    Comment: social   Drug use: No   Sexual activity: Not on file  Other Topics Concern   Not on file  Social History Narrative   Not on file   Social Drivers of Health   Financial Resource Strain: High Risk (03/08/2023)   Overall Financial Resource Strain (CARDIA)    Difficulty of Paying Living Expenses: Very hard  Food Insecurity: Food Insecurity Present (03/08/2023)   Hunger Vital Sign    Worried About Running Out of Food in the Last Year: Often true    Ran Out of Food in the Last Year: Often true  Transportation Needs: No  Transportation Needs (03/08/2023)   PRAPARE - Administrator, Civil Service (Medical): No    Lack of Transportation (Non-Medical): No  Physical Activity: Unknown (03/08/2023)   Exercise Vital Sign    Days of Exercise per Week: 5 days    Minutes of Exercise per Session: Patient declined  Stress: No Stress Concern Present (03/08/2023)   [employer] of Occupational Health - Occupational Stress Questionnaire    Feeling of Stress : Only a little  Social Connections: Moderately Isolated (03/08/2023)   Social Connection and Isolation Panel [NHANES]    Frequency of Communication with Friends and Family: More than three times a week    Frequency of Social Gatherings with Friends and Family: More than three times a week    Attends Religious Services: Never    Database Administrator or Organizations: No    Attends Engineer, Structural: Not on file    Marital Status: Married  Catering Manager Violence: Not on file    Outpatient Medications Prior to Visit  Medication Sig Dispense Refill   albuterol  (PROVENTIL  HFA;VENTOLIN  HFA) 108 (90 Base) MCG/ACT inhaler Inhale 1-2 puffs into the lungs every 6 (six) hours as needed for wheezing or shortness of breath.  albuterol  (PROVENTIL ) (2.5 MG/3ML) 0.083% nebulizer solution Take 3 mLs (2.5 mg total) by nebulization every 4 (four) hours as needed for wheezing or shortness of breath. 30 vial 1   atorvastatin (LIPITOR) 20 MG tablet Take 20 mg by mouth daily.     cyclobenzaprine  (FLEXERIL ) 5 MG tablet Take 1 tablet (5 mg total) by mouth at bedtime as needed for muscle spasms. 30 tablet 1   lisinopril (ZESTRIL) 20 MG tablet Take 20 mg by mouth daily.     acetaminophen  (TYLENOL ) 500 MG tablet Take 1,000 mg by mouth every 6 (six) hours as needed for mild pain or headache.     dicyclomine  (BENTYL ) 20 MG tablet Take 1 every 6-8 hours as needed for abdominal cramping (Patient not taking: Reported on 12/06/2022) 20 tablet 0   ibuprofen   (ADVIL ,MOTRIN ) 200 MG tablet Take 400 mg by mouth every 6 (six) hours as needed. (Patient not taking: Reported on 12/06/2022)     ondansetron  (ZOFRAN  ODT) 4 MG disintegrating tablet 4mg  ODT q4 hours prn nausea/vomit (Patient not taking: Reported on 12/06/2022) 12 tablet 0   No facility-administered medications prior to visit.    Allergies  Allergen Reactions   Iodinated Contrast Media    Iodine Itching    ROS Review of Systems  Constitutional:  Negative for fatigue and fever.  Eyes:  Negative for visual disturbance.  Respiratory:  Negative for chest tightness and shortness of breath.   Cardiovascular:  Negative for chest pain and palpitations.  Musculoskeletal:  Positive for back pain.  Neurological:  Negative for dizziness and headaches.      Objective:    Physical Exam HENT:     Head: Normocephalic.     Right Ear: External ear normal.     Left Ear: External ear normal.     Nose: No congestion or rhinorrhea.     Mouth/Throat:     Mouth: Mucous membranes are moist.  Cardiovascular:     Rate and Rhythm: Regular rhythm.     Heart sounds: No murmur heard. Pulmonary:     Effort: No respiratory distress.     Breath sounds: Normal breath sounds.  Musculoskeletal:     Left shoulder: Tenderness (mild) present. No swelling, deformity or crepitus. Normal range of motion.     Lumbar back: Tenderness present.  Neurological:     Mental Status: He is alert.     BP 132/89   Pulse 71   Ht 6' 1 (1.854 m)   Wt (!) 342 lb 1.3 oz (155.2 kg)   SpO2 95%   BMI 45.13 kg/m  Wt Readings from Last 3 Encounters:  03/08/23 (!) 342 lb 1.3 oz (155.2 kg)  12/30/22 (!) 341 lb 11.2 oz (155 kg)  12/06/22 (!) 340 lb 1.3 oz (154.3 kg)    No results found for: TSH Lab Results  Component Value Date   WBC 8.2 01/30/2018   HGB 14.1 01/30/2018   HCT 44.7 01/30/2018   MCV 90.1 01/30/2018   PLT 183 01/30/2018   Lab Results  Component Value Date   NA 136 01/30/2018   K 3.8 01/30/2018    CO2 24 01/30/2018   GLUCOSE 109 (H) 01/30/2018   BUN 13 01/30/2018   CREATININE 0.85 01/30/2018   BILITOT 1.0 01/30/2018   ALKPHOS 69 01/30/2018   AST 23 01/30/2018   ALT 42 01/30/2018   PROT 8.3 (H) 01/30/2018   ALBUMIN 4.1 01/30/2018   CALCIUM 8.8 (L) 01/30/2018   ANIONGAP 9 01/30/2018   No  results found for: CHOL No results found for: HDL No results found for: LDLCALC No results found for: TRIG No results found for: CHOLHDL No results found for: YHAJ8R    Assessment & Plan:  Primary hypertension Assessment & Plan: Blood Pressure Control in the Clinic The patient reports taking his blood pressure medication every other day, as remembered. He is encouraged to take his blood pressure medication consistently every day.  A low-sodium diet of less than 2300 mg daily is recommended, along with increased physical activity of moderate intensity, aiming for 150 minutes weekly. The patient is encouraged to continue with these lifestyle modifications to help manage his blood pressure effectively. BP Readings from Last 3 Encounters:  03/08/23 132/89  12/30/22 118/83  12/06/22 128/78      Lumbar back pain Assessment & Plan: An imaging study will be obtained to rule out underlying pathology. A referral has been placed for physical therapy. The patient is encouraged to maintain a healthy weight and avoid aggravating activities such as bending and lifting heavy objects. However, this is challenging for the patient, as their job requires heavy lifting and bending. Heat or cold applications are encouraged for lower back pain relief. No red flag symptoms for back pain were reported today. Therapy will be initiated with Robaxin  and Tylenol  for pain relief.     Orders: -     Ambulatory referral to Physical Therapy -     DG Lumbar Spine Complete -     Methocarbamol ; Take 1 tablet (500 mg total) by mouth 3 (three) times daily.  Dispense: 90 tablet; Refill: 1 -      Acetaminophen ; Take 2 tablets (1,000 mg total) by mouth every 6 (six) hours as needed for mild pain (pain score 1-3) or headache.  Dispense: 30 tablet; Refill: 1  Chronic left shoulder pain Assessment & Plan: The patient reports sustaining an injury to his left shoulder while snowboarding at age 29. After the injury, he followed up with physical therapy, which helped improve flexibility and reduce pain. The patient reports wanting to resume physical therapy, as he has been experiencing increasing stiffness in the left shoulder, with no recent injury or trauma reported. No weakness, numbness, tingling, or limited range of motion noted. A referral has been placed to physical therapy today.    Orders: -     DG Shoulder Left -     Methocarbamol ; Take 1 tablet (500 mg total) by mouth 3 (three) times daily.  Dispense: 90 tablet; Refill: 1 -     Acetaminophen ; Take 2 tablets (1,000 mg total) by mouth every 6 (six) hours as needed for mild pain (pain score 1-3) or headache.  Dispense: 30 tablet; Refill: 1  Hyperlipidemia LDL goal <100 Assessment & Plan: He reports taking his cholesterol medication, atorvastatin 20 mg, every other day rather than daily as prescribed. He is encouraged to take atorvastatin 20 mg daily as prescribed.  The patient was encouraged to make lifestyle changes, including avoiding simple carbohydrates such as cakes, sweet desserts, ice cream, soda (diet or regular), sweet tea, candies, chips, cookies, store-bought juices, excessive alcohol (more than 1-2 drinks per day), lemonade, artificial sweeteners, donuts, coffee creamers, and sugar-free products. Additionally, reducing the consumption of greasy, fatty foods and increasing physical activity were recommended. The patient verbalized understanding and is aware of the plan of care. .lastlip   Note: This chart has been completed using Engineer, Civil (consulting) software, and while attempts have been made to ensure accuracy,  certain words and  phrases may not be transcribed as intended.    Follow-up: Return in about 4 months (around 07/06/2023).   Vannary Greening, FNP

## 2023-03-08 NOTE — Assessment & Plan Note (Signed)
 Blood Pressure Control in the Clinic The patient reports taking his blood pressure medication every other day, as remembered. He is encouraged to take his blood pressure medication consistently every day.  A low-sodium diet of less than 2300 mg daily is recommended, along with increased physical activity of moderate intensity, aiming for 150 minutes weekly. The patient is encouraged to continue with these lifestyle modifications to help manage his blood pressure effectively. BP Readings from Last 3 Encounters:  03/08/23 132/89  12/30/22 118/83  12/06/22 128/78

## 2023-03-08 NOTE — Patient Instructions (Addendum)
 I appreciate the opportunity to provide care to you today!    Follow up:  4 months  Labs: please stop by the lab today to get your blood drawn (CBC, CMP, TSH, Lipid profile, HgA1c, Vit D)  Please stop by [facility] to get an x-ray of your lower back and left shoulder.  Start taking Robaxin  500 mg TID as needed for lower back pain. Start taking Tylenol  500 mg every 6 hours as needed for back and shoulder pain. I recommend rest, alternating heat and cold therapy for 15-20 minutes every 2-3 hours as needed.   Referrals today-  PT  Attached with your AVS, you will find valuable resources for self-education. I highly recommend dedicating some time to thoroughly examine them.   Please continue to a heart-healthy diet and increase your physical activities. Try to exercise for at least five days a week.    It was a pleasure to see you and I look forward to continuing to work together on your health and well-being. Please do not hesitate to call the office if you need care or have questions about your care.  In case of emergency, please visit the Emergency Department for urgent care, or contact our clinic at [phone] to schedule an appointment. We're here to help you!   Have a wonderful day and week. With Gratitude, Nautica Hotz MSN, FNP-BC

## 2023-03-09 LAB — CBC WITH DIFFERENTIAL/PLATELET
Basophils Absolute: 0.1 10*3/uL (ref 0.0–0.2)
Basos: 1 %
EOS (ABSOLUTE): 0.2 10*3/uL (ref 0.0–0.4)
Eos: 2 %
Hematocrit: 43.6 % (ref 37.5–51.0)
Hemoglobin: 14 g/dL (ref 13.0–17.7)
Immature Grans (Abs): 0 10*3/uL (ref 0.0–0.1)
Immature Granulocytes: 0 %
Lymphocytes Absolute: 1.8 10*3/uL (ref 0.7–3.1)
Lymphs: 23 %
MCH: 27.4 pg (ref 26.6–33.0)
MCHC: 32.1 g/dL (ref 31.5–35.7)
MCV: 85 fL (ref 79–97)
Monocytes Absolute: 0.8 10*3/uL (ref 0.1–0.9)
Monocytes: 10 %
Neutrophils Absolute: 4.9 10*3/uL (ref 1.4–7.0)
Neutrophils: 64 %
Platelets: 186 10*3/uL (ref 150–450)
RBC: 5.11 x10E6/uL (ref 4.14–5.80)
RDW: 12.3 % (ref 11.6–15.4)
WBC: 7.8 10*3/uL (ref 3.4–10.8)

## 2023-03-09 LAB — HIV ANTIBODY (ROUTINE TESTING W REFLEX): HIV Screen 4th Generation wRfx: NONREACTIVE

## 2023-03-09 LAB — LIPID PANEL
Chol/HDL Ratio: 5.6 {ratio} — ABNORMAL HIGH (ref 0.0–5.0)
Cholesterol, Total: 175 mg/dL (ref 100–199)
HDL: 31 mg/dL — ABNORMAL LOW (ref >39)
LDL Chol Calc (NIH): 119 mg/dL — ABNORMAL HIGH (ref 0–99)
Triglycerides: 137 mg/dL (ref 0–149)
VLDL Cholesterol Cal: 25 mg/dL (ref 5–40)

## 2023-03-09 LAB — VITAMIN D 25 HYDROXY (VIT D DEFICIENCY, FRACTURES): Vit D, 25-Hydroxy: 19.7 ng/mL — ABNORMAL LOW (ref 30.0–100.0)

## 2023-03-09 LAB — HEMOGLOBIN A1C
Est. average glucose Bld gHb Est-mCnc: 111 mg/dL
Hgb A1c MFr Bld: 5.5 % (ref 4.8–5.6)

## 2023-03-09 LAB — CMP14+EGFR
ALT: 31 [IU]/L (ref 0–44)
AST: 15 [IU]/L (ref 0–40)
Albumin: 4.3 g/dL (ref 4.1–5.1)
Alkaline Phosphatase: 80 [IU]/L (ref 44–121)
BUN/Creatinine Ratio: 21 — ABNORMAL HIGH (ref 9–20)
BUN: 15 mg/dL (ref 6–20)
Bilirubin Total: 0.6 mg/dL (ref 0.0–1.2)
CO2: 22 mmol/L (ref 20–29)
Calcium: 9.1 mg/dL (ref 8.7–10.2)
Chloride: 104 mmol/L (ref 96–106)
Creatinine, Ser: 0.71 mg/dL — ABNORMAL LOW (ref 0.76–1.27)
Globulin, Total: 2.9 g/dL (ref 1.5–4.5)
Glucose: 98 mg/dL (ref 70–99)
Potassium: 4.6 mmol/L (ref 3.5–5.2)
Sodium: 138 mmol/L (ref 134–144)
Total Protein: 7.2 g/dL (ref 6.0–8.5)
eGFR: 123 mL/min/{1.73_m2} (ref >59)

## 2023-03-09 LAB — TSH+FREE T4
Free T4: 1.11 ng/dL (ref 0.82–1.77)
TSH: 2.47 u[IU]/mL (ref 0.450–4.500)

## 2023-03-09 LAB — HEPATITIS C ANTIBODY: Hep C Virus Ab: NONREACTIVE

## 2023-03-09 NOTE — Progress Notes (Signed)
 Please inform the patient that his back X-ray shows no recent fractures or dislocations, which is good news. However, there is a small, long-standing change in one of the bones in the lower back (T12 vertebra). This likely happened over time due to natural aging, mild bone weakening, or a past minor injury. It is not a new injury and does not appear to be a serious concern.

## 2023-03-09 NOTE — Progress Notes (Signed)
 Please inform the patient that his left shoulder X-ray was negative, meaning no fractures, dislocations, or other acute abnormalities were detected. This indicates that the bone and joint structures appear normal, with no signs of significant injury or disease.

## 2023-03-10 ENCOUNTER — Other Ambulatory Visit: Payer: Self-pay | Admitting: Family Medicine

## 2023-03-10 DIAGNOSIS — E559 Vitamin D deficiency, unspecified: Secondary | ICD-10-CM

## 2023-03-10 MED ORDER — VITAMIN D (ERGOCALCIFEROL) 1.25 MG (50000 UNIT) PO CAPS: 50000.0000 [IU] | ORAL_CAPSULE | ORAL | 1 refills | Status: DC

## 2023-03-10 NOTE — Telephone Encounter (Signed)
Please inform the patient to continue following up with physical therapy (PT). Flexeril was discontinued, and a prescription for Robaxin was sent instead.

## 2023-03-17 ENCOUNTER — Ambulatory Visit (HOSPITAL_COMMUNITY): Payer: BC Managed Care – PPO | Attending: Family Medicine

## 2023-03-17 ENCOUNTER — Other Ambulatory Visit: Payer: Self-pay

## 2023-03-17 ENCOUNTER — Ambulatory Visit (HOSPITAL_COMMUNITY): Payer: BC Managed Care – PPO

## 2023-03-17 DIAGNOSIS — G8929 Other chronic pain: Secondary | ICD-10-CM | POA: Insufficient documentation

## 2023-03-17 DIAGNOSIS — M5459 Other low back pain: Secondary | ICD-10-CM | POA: Insufficient documentation

## 2023-03-17 DIAGNOSIS — M25511 Pain in right shoulder: Secondary | ICD-10-CM | POA: Diagnosis not present

## 2023-03-17 DIAGNOSIS — M545 Low back pain, unspecified: Secondary | ICD-10-CM | POA: Diagnosis not present

## 2023-03-17 DIAGNOSIS — M25512 Pain in left shoulder: Secondary | ICD-10-CM | POA: Diagnosis not present

## 2023-03-17 NOTE — Therapy (Signed)
OUTPATIENT PHYSICAL THERAPY  UPPER EXTREMITY/THORACOLUMBAR EVALUATION   Patient Name: Bruce Nguyen MRN: [id number] DOB:10-19-1985, 38 y.o., male Today's Date: 03/17/2023  END OF SESSION:  PT End of Session - 03/17/23 1113     Visit Number 1    Number of Visits 8    Date for PT Re-Evaluation 04/14/23    Authorization Type [employer] PPO (no auth)    PT Start Time 0845    PT Stop Time 0940    PT Time Calculation (min) 55 min    Activity Tolerance Patient limited by pain    Behavior During Therapy [facility] for tasks assessed/performed            Past Medical History:  Diagnosis Date   ADHD    Asthma    Enlarged heart    OCD (obsessive compulsive disorder)    Past Surgical History:  Procedure Laterality Date   COLONOSCOPY     Patient Active Problem List   Diagnosis Date Noted   Chronic left shoulder pain 03/08/2023   Hypertension 12/06/2022   Hyperlipidemia LDL goal <100 12/06/2022   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  SUBJECTIVE:                                                                                                                                                                                           SUBJECTIVE STATEMENT: EVAL: Arrives to the clinic with c/o B shoulder pain (L>R) and low back pain. Patient has some numbness on his B fingers which are very random but mostly when he's driving. Condition on the low back started when he was 20 due to a snow boarding incident. Patient also had a lifting incident when he was 19. Claims that he "did not break his back". Condition on the  shoulder began when they "broke the knot" on his back in a rehabilitation place in [facility] in 2023. Had PT on his low back with helped. Patient did not receive any rx for the shoulder yet. X-ray was done on the shoulder which revealed unremarkable results. Patient was just referred to outpatient PT evaluation and management. Patient is more concerned about his shoulder at this time than his back.  PERTINENT HISTORY:  hernia  PAIN:  Are  you having pain? Yes: NPRS scale: 3/10 on back, 0/10 shoulder Pain location: localized on low back, top and front of the shoulders Pain description: sharp pain on the low back, tearing pain Aggravating factors: lifting arm with pain 8-9/10, standing for ~ 4 hours for the low back  Relieving factors: tylenol, ibuprofen  PRECAUTIONS: None  RED FLAGS: None   WEIGHT BEARING RESTRICTIONS: No  FALLS:  Has patient fallen in last 6 months? 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  DIAGNOSTIC FINDINGS:  03/08/23 LUMBAR SPINE - COMPLETE 4+ VIEW   COMPARISON:  None Available.   FINDINGS: There is no evidence of lumbar spine fracture. Minimal chronic compression deformity of superior endplate T12. Alignment is normal. Intervertebral disc spaces are maintained.   IMPRESSION: No acute fracture or dislocation. Minimal chronic compression deformity of superior endplate T12.  03/08/23 LEFT SHOULDER - 2+ VIEW   COMPARISON:  None Available.   FINDINGS: There is no evidence of fracture or dislocation.  There is no evidence of arthropathy or other focal bone abnormality. Soft tissues are unremarkable.  PATIENT SURVEYS:  Modified Oswestry 10/50 = 20%  Quick Dash 52.3%  COGNITION: Overall cognitive status: Within functional limits for tasks assessed     SENSATION: Light touch: WFL on B fingers  MUSCLE LENGTH: Hamstrings: moderate restriction on B Thomas test: moderate restriction on B hip flexors Piriformis: moderate restriction on B Upper trapezius: moderate restriction on B  POSTURE: rounded shoulders, forward head, and anterior pelvic tilt  PALPATION: Grade 1 tenderness on the R greater tuberosity Grade 1 tenderness on general muscle mass of the low back  CERVICAL ROM: WFL on flex/ext, rot on B, and lat flex on B  LUMBAR ROM:   AROM eval  Flexion 100%  Extension   Right lateral flexion   Left lateral flexion   Right rotation   Left rotation    (Blank rows = not tested)  UPPER EXTREMITY AROM Active  Right eval Left eval  Shoulder flexion [facility]  Shoulder extension    Shoulder abduction WFL 95  Shoulder internal rotation WFL 60  Shoulderexternal rotation WFL 40  Elbow flexion [facility] WFL  Elbow extension [facility] WFL  Wrist flex [facility] WFL  Wrist ext [facility] WFL  Grip     UPPER EXTREMITY MMT Active  Right eval Left eval  Shoulder flexion 5 3+  Shoulder extension    Shoulder abduction 4 3-  Shoulder internal rotation 5 3+  Shoulderexternal rotation 5 3+  Elbow flexion 5 5  Elbow extension 5 5  Wrist flex 5 5  Wrist ext 5 5  Grip WFL WFL    LOWER EXTREMITY ROM:     Active  Right eval Left eval  Hip flexion [facility]  Hip extension [facility]  Hip abduction [facility]  Hip adduction    Hip internal rotation    Hip external rotation    Knee flexion [facility]  Knee extension [facility]  Ankle dorsiflexion [facility]  Ankle plantarflexion [facility] WFL  Ankle inversion    Ankle eversion     (  Blank rows = not tested)  LOWER EXTREMITY MMT:    MMT Right eval Left eval   Hip flexion 4 4  Hip extension 3+ 4-  Hip abduction 3+ 4-  Hip adduction    Hip internal rotation    Hip external rotation    Knee flexion 4+ 4+  Knee extension 4+ 4+  Ankle dorsiflexion 5 5  Ankle plantarflexion 5 5  Ankle inversion    Ankle eversion     (Blank rows = not tested)  SHOULDER SPECIAL TESTS: (+) L empty can, Neer impingement, Hawkins-Kennedy L lift off; (-) Roos test, AC joint compression test  LUMBAR SPECIAL TESTS:  Straight leg raise test: aggravates low back pain, FABER test: Negative, and Thomas test: Negative  FUNCTIONAL TESTS:  5 times sit to stand: 8.19 sec 2 minute walk test: 593 ft  GAIT: Distance walked: 593 ft Assistive device utilized: None Level of assistance: Complete Independence Comments: done during , WFL  TREATMENT DATE:  03/17/23 Evaluation and patient education done                                                                                                                               PATIENT EDUCATION:  Education details: Educated on the pathoanatomy of low back and shoulder pain pain. Educated on the goals and course of rehab.  Person educated: Patient Education method: Explanation Education comprehension: verbalized understanding  HOME EXERCISE PROGRAM: None provided to date  ASSESSMENT:  CLINICAL IMPRESSION: EVAL: Patient is a 38 y.o. male who was seen today for physical therapy evaluation and treatment for low back and shoulder pain. Patient's condition is further defined by difficulty with bending/lifting and overhead activities due to pain, weakness, and decreased soft tissue extensibility. Based on the objective findings above, patient may be presenting s/sx of shoulder impingement. Skilled PT is required to address the impairments and functional limitations listed below.    OBJECTIVE IMPAIRMENTS: decreased mobility, decreased ROM, decreased strength, impaired flexibility, postural dysfunction, and pain.   ACTIVITY  LIMITATIONS: carrying, lifting, bending, standing, and bed mobility  PARTICIPATION LIMITATIONS: meal prep, cleaning, laundry, driving, shopping, community activity, occupation, and yard work  PERSONAL FACTORS: Time since onset of injury/illness/exacerbation are also affecting patient's functional outcome.   REHAB POTENTIAL: Good  CLINICAL DECISION MAKING: Stable/uncomplicated  EVALUATION COMPLEXITY: Low   GOALS: Goals reviewed with patient? Yes  SHORT TERM GOALS: Target date: 03/31/23  Pt will demonstrate indep in HEP to facilitate carry-over of skilled services and improve functional outcomes Goal status: INITIAL   LONG TERM GOALS: Target date: 04/14/23  Pt will demonstrate a decrease in modified ODI score by 13-14% to demonstrate significant improvement in ADLs  Baseline: 20% Goal status: INITIAL  2.  Pt will have a decrease in QDASH score by 9 points to demonstrate significant improvement in UE function  Baseline: 52.3/100 Goal status: INITIAL  3.  Pt will demonstrate increase in LE strength to 4+/5 to facilitate  ease and safety in ambulation Baseline: 3+/5 Goal status: INITIAL  4.  Patient will demonstrate increase in UE strength to 4/5 to facilitate ease in ADLs Baseline: 3-/5 Goal status: INITIAL  5.  Pt will be able to lift the arm overhead with little to slight pain (2-3/10) to facilitate ease in ADLs Baseline: 9/10 Goal status: INITIAL  6.  Pt will be able to stand > 4 hours with little pain (1-2/10) to facilitate ease at work. Baseline:  Goal status: INITIAL  7. Pt will demonstrate [facility] shoulder abd AROM to facilitate ease in ADLs. Baseline 95 deg Goal status: INITIAL  PLAN:  PT FREQUENCY: 2x/week  PT DURATION: 4 weeks  PLANNED INTERVENTIONS: 97164- PT Re-evaluation, 97110-Therapeutic exercises, 97530- Therapeutic activity, 97112- Neuromuscular re-education, 97535- Self Care, 14782- Manual therapy, 97014- Electrical stimulation (unattended),  Patient/Family education, Taping, Dry Needling, Cryotherapy, and Moist heat.  PLAN FOR NEXT SESSION: Provide HEP. May assess middle/lower trapezius strength. Assess trunk rotation ROM. Begin UE, core and LE strengthening.   Tish Frederickson. Victorino Fatzinger, PT, DPT, OCS Board-Certified Clinical Specialist in Orthopedic PT PT Compact Privilege # (Fellows): [id number] T 03/17/2023, 11:59 AM

## 2023-03-21 ENCOUNTER — Encounter (HOSPITAL_COMMUNITY): Payer: Self-pay

## 2023-03-21 ENCOUNTER — Ambulatory Visit (HOSPITAL_COMMUNITY): Payer: BC Managed Care – PPO

## 2023-03-21 DIAGNOSIS — M25511 Pain in right shoulder: Secondary | ICD-10-CM | POA: Diagnosis not present

## 2023-03-21 DIAGNOSIS — M5459 Other low back pain: Secondary | ICD-10-CM | POA: Diagnosis not present

## 2023-03-21 DIAGNOSIS — G8929 Other chronic pain: Secondary | ICD-10-CM | POA: Diagnosis not present

## 2023-03-21 DIAGNOSIS — M25512 Pain in left shoulder: Secondary | ICD-10-CM | POA: Diagnosis not present

## 2023-03-21 DIAGNOSIS — M545 Low back pain, unspecified: Secondary | ICD-10-CM | POA: Diagnosis not present

## 2023-03-21 NOTE — Therapy (Signed)
OUTPATIENT PHYSICAL THERAPY  UPPER EXTREMITY/THORACOLUMBAR TREATMENT   Patient Name: Bruce Nguyen MRN: [id number] DOB:[birth year], 38 y.o., male Today's Date: 03/21/2023  END OF SESSION:  PT End of Session - 03/21/23 0844     Visit Number 2    Number of Visits 8    Date for PT Re-Evaluation 04/14/23    Authorization Type [employer] P[employer] (no auth)    PT Start Time [phone]    PT Stop Time 0928    PT Time Calculation (min) 42 min    Activity Tolerance Patient limited by pain;Patient tolerated treatment well    Behavior During Therapy [facility] for tasks assessed/performed            Past Medical History:  Diagnosis Date   ADHD    Asthma    Enlarged heart    OCD (obsessive compulsive disorder)    Past Surgical History:  Procedure Laterality Date   COLONOSCOPY     Patient Active Problem List   Diagnosis Date Noted   Chronic left shoulder pain 03/08/2023   Hypertension 12/06/2022   Hyperlipidemia LDL goal <100 12/06/2022   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  SUBJECTIVE:                                                                                                                                                                                           SUBJECTIVE STATEMENT: 03/21/23:  Bil shoulders pain scale 6/10, with constant sharp pain (L>R).  LBP increases with standing, pain is low.  EVAL: Arrives to the clinic with c/o B shoulder pain (L>R) and low back pain. Patient has some numbness on his B fingers which are very random but mostly when he's driving. Condition on the low back started when he  was 20 due to a snow boarding incident. Patient also had a lifting incident when he was 19. Claims that he "did not break his back". Condition on the shoulder began when they "broke the knot" on his back in a rehabilitation place in [facility] in 2023. Had PT on his low back with helped. Patient did not receive any rx for the shoulder yet. X-ray was done on the shoulder which revealed unremarkable results. Patient was just referred to outpatient PT evaluation  and management. Patient is more concerned about his shoulder at this time than his back.  PERTINENT HISTORY:  hernia  PAIN:  Are you having pain? Yes: NPRS scale: 2/10 on back, 6/10 shoulder Pain location: localized on low back, top and front of the shoulders Pain description: sharp pain on the low back, tearing pain Aggravating factors: lifting arm with pain 8-9/10, standing for ~ 4 hours for the low back  Relieving factors: tylenol, ibuprofen  PRECAUTIONS: None  RED FLAGS: None   WEIGHT BEARING RESTRICTIONS: No  FALLS:  Has patient fallen in last 6 months? 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  DIAGNOSTIC FINDINGS:  03/08/23 LUMBAR SPINE - COMPLETE 4+ VIEW   COMPARISON:  None Available.   FINDINGS: There is no evidence of lumbar spine fracture. Minimal chronic compression deformity of superior endplate T12. Alignment is normal. Intervertebral disc spaces are maintained.   IMPRESSION: No acute fracture or dislocation. Minimal chronic compression deformity of  superior endplate T12.  03/08/23 LEFT SHOULDER - 2+ VIEW   COMPARISON:  None Available.   FINDINGS: There is no evidence of fracture or dislocation. There is no evidence of arthropathy or other focal bone abnormality. Soft tissues are unremarkable.  PATIENT SURVEYS:  Modified Oswestry 10/50 = 20%  Quick Dash 52.3%  COGNITION: Overall cognitive status: Within functional limits for tasks assessed     SENSATION: Light touch: WFL on B fingers  MUSCLE LENGTH: Hamstrings: moderate restriction on B Thomas test: moderate restriction on B hip flexors Piriformis: moderate restriction on B Upper trapezius: moderate restriction on B  POSTURE: rounded shoulders, forward head, and anterior pelvic tilt  PALPATION: Grade 1 tenderness on the R greater tuberosity Grade 1 tenderness on general muscle mass of the low back  CERVICAL ROM: WFL on flex/ext, rot on B, and lat flex on B  LUMBAR ROM:   AROM eval  Flexion 100%  Extension   Right lateral flexion   Left lateral flexion   Right rotation   Left rotation    (Blank rows = not tested)  UPPER EXTREMITY AROM Active  Right eval Left eval  Shoulder flexion [facility]  Shoulder extension    Shoulder abduction WFL 95  Shoulder internal rotation WFL 60  Shoulderexternal rotation WFL 40  Elbow flexion [facility] WFL  Elbow extension [facility] WFL  Wrist flex [facility] WFL  Wrist ext [facility] WFL  Grip     UPPER EXTREMITY MMT Active  Right eval Left eval  Shoulder flexion 5 3+  Shoulder extension    Shoulder abduction 4 3-  Shoulder internal rotation 5 3+  Shoulderexternal rotation 5 3+  Elbow flexion 5 5  Elbow extension 5 5  Wrist flex 5 5  Wrist ext 5 5  Grip WFL WFL    LOWER EXTREMITY ROM:     Active  Right eval Left eval  Hip flexion [facility]  Hip extension [facility]  Hip abduction [facility]  Hip adduction    Hip internal rotation    Hip external rotation    Knee flexion [facility]  Knee  extension [facility]  Ankle dorsiflexion [facility] WFL   Ankle plantarflexion [facility] WFL  Ankle inversion    Ankle eversion     (Blank rows = not tested)  LOWER EXTREMITY MMT:    MMT Right eval Left eval  Hip flexion 4 4  Hip extension 3+ 4-  Hip abduction 3+ 4-  Hip adduction    Hip internal rotation    Hip external rotation    Knee flexion 4+ 4+  Knee extension 4+ 4+  Ankle dorsiflexion 5 5  Ankle plantarflexion 5 5  Ankle inversion    Ankle eversion     (Blank rows = not tested)  SHOULDER SPECIAL TESTS: (+) L empty can, Neer impingement, Hawkins-Kennedy L lift off; (-) Roos test, AC joint compression test  LUMBAR SPECIAL TESTS:  Straight leg raise test: aggravates low back pain, FABER test: Negative, and Thomas test: Negative  FUNCTIONAL TESTS:  5 times sit to stand: 8.19 sec 2 minute walk test: 593 ft  GAIT: Distance walked: 593 ft Assistive device utilized: None Level of assistance: Complete Independence Comments: done during , WFL  TREATMENT DATE:  03/21/23: Reviewed goals Educated importance of HEP  Trunk Rotation ROM Lt 28 degrees Rt 30 degrees   Prone MMT Mid trap  L 3- painful   R 3/5 Lower trap L 3-/5 painful   Rt 3/5  Supine: Bridge 10x 5" Decompression 2-5 c/o pulling during scapular retraction 5x 5" UE PVC PROM 5x 5"  Seated: Postural awareness Posterior shoulder rolls Seated TrA 10x5"     03/17/23  Evaluation and patient education done                                                                                                                               PATIENT EDUCATION:  Education details: Educated on the pathoanatomy of low back and shoulder pain pain. Educated on the goals and course of rehab.  Person educated: Patient Education method: Explanation Education comprehension: verbalized understanding  HOME EXERCISE PROGRAM: Access Code: [email or web address] URL: [email or web address] Date: 03/21/2023 Prepared by: Becky Sax  Exercises - Correct Seated Posture  -  3-5 x daily - 7 x weekly - 1 sets - 5 reps - 5" hold - Seated Shoulder Rolls  - 2 x daily - 7 x weekly - 1 sets - 10 reps - Seated Transversus Abdominis Bracing  - 2 x daily - 7 x weekly - 2 sets - 10 reps - 5" hold  ASSESSMENT:  CLINICAL IMPRESSION: 03/21/23:  Reviewed goals and educated importance of HEP compliance.  Objective testing including trunk rotation ROM and MMT for mid and lower traps complete with noted weakness Lt >Rt and limited by pain.  Pt presents with pelvic rotation in seated position and forward head.  Educated importance of posture to assist with pain control.  Pt limited by pain with shoulder ROM based exercises and c/o posterior arm pain following pec stretch.  Added postural awareness  to HEP with additional core strengthening as able to complete in pain free range.     EVAL: Patient is a 38 y.o. male who was seen today for physical therapy evaluation and treatment for low back and shoulder pain. Patient's condition is further defined by difficulty with bending/lifting and overhead activities due to pain, weakness, and decreased soft tissue extensibility. Based on the objective findings above, patient may be presenting s/sx of shoulder impingement. Skilled PT is required to address the impairments and functional limitations listed below.    OBJECTIVE IMPAIRMENTS: decreased mobility, decreased ROM, decreased strength, impaired flexibility, postural dysfunction, and pain.   ACTIVITY LIMITATIONS: carrying, lifting, bending, standing, and bed mobility  PARTICIPATION LIMITATIONS: meal prep, cleaning, laundry, driving, shopping, community activity, occupation, and yard work  PERSONAL FACTORS: Time since onset of injury/illness/exacerbation are also affecting patient's functional outcome.   REHAB POTENTIAL: Good  CLINICAL DECISION MAKING: Stable/uncomplicated  EVALUATION COMPLEXITY: Low   GOALS: Goals reviewed with patient? Yes  SHORT TERM GOALS: Target date:  03/31/23  Pt will demonstrate indep in HEP to facilitate carry-over of skilled services and improve functional outcomes Goal status: INITIAL   LONG TERM GOALS: Target date: 04/14/23  Pt will demonstrate a decrease in modified ODI score by 13-14% to demonstrate significant improvement in ADLs  Baseline: 20% Goal status: INITIAL  2.  Pt will have a decrease in QDASH score by 9 points to demonstrate significant improvement in UE function  Baseline: 52.3/100 Goal status: INITIAL  3.  Pt will demonstrate increase in LE strength to 4+/5 to facilitate ease and safety in ambulation Baseline: 3+/5 Goal status: INITIAL  4.  Patient will demonstrate increase in UE strength to 4/5 to facilitate ease in ADLs Baseline: 3-/5 Goal status: INITIAL  5.  Pt will be able to lift the arm overhead with little to slight pain (2-3/10) to facilitate ease in ADLs Baseline: 9/10 Goal status: INITIAL  6.  Pt will be able to stand > 4 hours with little pain (1-2/10) to facilitate ease at work. Baseline:  Goal status: INITIAL  7. Pt will demonstrate [facility] shoulder abd AROM to facilitate ease in ADLs. Baseline 95 deg Goal status: INITIAL  PLAN:  PT FREQUENCY: 2x/week  PT DURATION: 4 weeks  PLANNED INTERVENTIONS: 97164- PT Re-evaluation, 97110-Therapeutic exercises, 97530- Therapeutic activity, 97112- Neuromuscular re-education, 97535- Self Care, 84132- Manual therapy, 97014- Electrical stimulation (unattended), Patient/Family education, Taping, Dry Needling, Cryotherapy, and Moist heat.  PLAN FOR NEXT SESSION: Provide HEP.  Begin UE, core and LE strengthening.  Becky Sax, LPTA/CLT; [place] [phone]  03/21/2023, 10:33 AM

## 2023-03-23 ENCOUNTER — Ambulatory Visit (HOSPITAL_COMMUNITY): Payer: BC Managed Care – PPO

## 2023-03-23 DIAGNOSIS — M5459 Other low back pain: Secondary | ICD-10-CM | POA: Diagnosis not present

## 2023-03-23 DIAGNOSIS — G8929 Other chronic pain: Secondary | ICD-10-CM

## 2023-03-23 DIAGNOSIS — M545 Low back pain, unspecified: Secondary | ICD-10-CM | POA: Diagnosis not present

## 2023-03-23 DIAGNOSIS — M25511 Pain in right shoulder: Secondary | ICD-10-CM | POA: Diagnosis not present

## 2023-03-23 DIAGNOSIS — M25512 Pain in left shoulder: Secondary | ICD-10-CM | POA: Diagnosis not present

## 2023-03-23 NOTE — Therapy (Addendum)
OUTPATIENT PHYSICAL THERAPY  UPPER EXTREMITY/THORACOLUMBAR TREATMENT   Patient Name: Bruce Nguyen MRN: [id number] DOB:1985/12/03, 38 y.o., male Today's Date: 03/23/2023  END OF SESSION:  PT End of Session - 03/23/23 0941     Visit Number 3    Number of Visits 8    Date for PT Re-Evaluation 04/14/23    Authorization Type [employer] PPO (no auth)    PT Start Time 0840    PT Stop Time 0925    PT Time Calculation (min) 45 min    Activity Tolerance Patient limited by pain    Behavior During Therapy [facility] for tasks assessed/performed            Past Medical History:  Diagnosis Date   ADHD    Asthma    Enlarged heart    OCD (obsessive compulsive disorder)    Past Surgical History:  Procedure Laterality Date   COLONOSCOPY     Patient Active Problem List   Diagnosis Date Noted   Chronic left shoulder pain 03/08/2023   Hypertension 12/06/2022   Hyperlipidemia LDL goal <100 12/06/2022   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  SUBJECTIVE:                                                                                                                                                                                           SUBJECTIVE STATEMENT: 03/23/23:  Current shoulder pain = 4/10 and denies any pain on the low back. Patient wants to focus on his shoulder for today's session. Patient states that he was sore after the last session.  EVAL: Arrives to the clinic with c/o B shoulder pain (L>R) and low back pain. Patient has some numbness on his B fingers which are very random but mostly when he's driving.  Condition on the low back started when he was 20 due to a snow boarding incident. Patient also had a lifting incident when he was 19. Claims that he "did not break his back". Condition on the shoulder began when they "broke the knot" on his back in a rehabilitation place in [facility] in 2023. Had PT on his low back with helped. Patient did not receive any rx for the shoulder yet. X-ray was done on the shoulder  which revealed unremarkable results. Patient was just referred to outpatient PT evaluation and management. Patient is more concerned about his shoulder at this time than his back.  PERTINENT HISTORY:  hernia  PAIN:  Are you having pain? Yes: NPRS scale: 2/10 on back, 6/10 shoulder Pain location: localized on low back, top and front of the shoulders Pain description: sharp pain on the low back, tearing pain Aggravating factors: lifting arm with pain 8-9/10, standing for ~ 4 hours for the low back  Relieving factors: tylenol, ibuprofen  PRECAUTIONS: None  RED FLAGS: None   WEIGHT BEARING RESTRICTIONS: No  FALLS:  Has patient fallen in last 6 months? 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  DIAGNOSTIC FINDINGS:  03/08/23 LUMBAR SPINE - COMPLETE 4+ VIEW   COMPARISON:  None Available.   FINDINGS: There is no evidence of lumbar spine fracture. Minimal chronic compression deformity of superior endplate T12. Alignment is normal. Intervertebral disc spaces are maintained.   IMPRESSION: No acute fracture or dislocation. Minimal  chronic compression deformity of superior endplate T12.  03/08/23 LEFT SHOULDER - 2+ VIEW   COMPARISON:  None Available.   FINDINGS: There is no evidence of fracture or dislocation. There is no evidence of arthropathy or other focal bone abnormality. Soft tissues are unremarkable.  PATIENT SURVEYS:  Modified Oswestry 10/50 = 20%  Quick Dash 52.3%  COGNITION: Overall cognitive status: Within functional limits for tasks assessed     SENSATION: Light touch: WFL on B fingers  MUSCLE LENGTH: Hamstrings: moderate restriction on B Thomas test: moderate restriction on B hip flexors Piriformis: moderate restriction on B Upper trapezius: moderate restriction on B  POSTURE: rounded shoulders, forward head, and anterior pelvic tilt  PALPATION: Grade 1 tenderness on the R greater tuberosity Grade 1 tenderness on general muscle mass of the low back  CERVICAL ROM: WFL on flex/ext, rot on B, and lat flex on B  LUMBAR ROM:   AROM eval  Flexion 100%  Extension   Right lateral flexion   Left lateral flexion   Right rotation   Left rotation    (Blank rows = not tested)  UPPER EXTREMITY AROM Active  Right eval Left eval  Shoulder flexion [facility]  Shoulder extension    Shoulder abduction WFL 95  Shoulder internal rotation WFL 60  Shoulderexternal rotation WFL 40  Elbow flexion [facility] WFL  Elbow extension [facility] WFL  Wrist flex [facility] WFL  Wrist ext [facility] WFL  Grip     UPPER EXTREMITY MMT Active  Right eval Left eval  Shoulder flexion 5 3+  Shoulder extension    Shoulder abduction 4 3-  Shoulder internal rotation 5 3+  Shoulderexternal rotation 5 3+  Elbow flexion 5 5  Elbow extension 5 5  Wrist flex 5 5  Wrist ext 5 5  Grip WFL WFL    LOWER EXTREMITY ROM:     Active  Right eval Left eval  Hip flexion [facility]  Hip extension [facility]  Hip abduction [facility]  Hip adduction    Hip internal rotation  Hip external rotation    Knee flexion [facility]  Knee extension [facility]  [facility]  Ankle dorsiflexion [facility]  Ankle plantarflexion [facility] WFL  Ankle inversion    Ankle eversion     (Blank rows = not tested)  LOWER EXTREMITY MMT:    MMT Right eval Left eval  Hip flexion 4 4  Hip extension 3+ 4-  Hip abduction 3+ 4-  Hip adduction    Hip internal rotation    Hip external rotation    Knee flexion 4+ 4+  Knee extension 4+ 4+  Ankle dorsiflexion 5 5  Ankle plantarflexion 5 5  Ankle inversion    Ankle eversion     (Blank rows = not tested)  SHOULDER SPECIAL TESTS: (+) L empty can, Neer impingement, Hawkins-Kennedy L lift off; (-) Roos test, AC joint compression test  LUMBAR SPECIAL TESTS:  Straight leg raise test: aggravates low back pain, FABER test: Negative, and Thomas test: Negative  FUNCTIONAL TESTS:  5 times sit to stand: 8.19 sec 2 minute walk test: 593 ft  GAIT: Distance walked: 593 ft Assistive device utilized: None Level of assistance: Complete Independence Comments: done during , WFL  TREATMENT DATE:  03/23/23 L upper trapezius stretch x 30" x 3 Gr 2 inf and post L GH oscillation x 2' each L scapular mobilization medial and inf/downward glide x 10 x 2 each Supine Shoulder AAROM flex, and ER with a dowel x 10 x 2 each Supine Shoulder AAROM abd and ER with a dowel x 10  Supine shoulder flex to 90 deg x 2 lb x 10 x 2 Sidelying shoulder abd to 90 deg x 10 x 2 Serratus ant activation on wall x 10 x 2  03/21/23: Reviewed goals Educated importance of HEP  Trunk Rotation ROM Lt 28 degrees Rt 30 degrees   Prone MMT Mid trap  L 3- painful   R 3/5 Lower trap L 3-/5 painful   Rt 3/5  Supine: Bridge 10x 5" Decompression 2-5 c/o pulling during scapular retraction 5x 5" UE PVC PROM 5x 5"  Seated: Postural awareness Posterior shoulder rolls Seated TrA 10x5"     03/17/23  Evaluation and patient education done                                                                                                                                PATIENT EDUCATION:  Education details: Educated on the pathoanatomy of low back and shoulder pain pain. Educated on the goals and course of rehab.  Person educated: Patient Education method: Explanation Education comprehension: verbalized understanding  HOME EXERCISE PROGRAM: Access Code: [email or web address] URL: [email or web address] 03/23/2023 - Supine Shoulder Flexion with Dowel  - 1 x daily - 7 x weekly - 1 sets - 10 reps - Supine Shoulder Abduction AAROM with Dowel  - 1 x daily - 7 x weekly - 1 sets - 10 reps - Supine Shoulder External Rotation AAROM with Dowel  -  1 x daily - 7 x weekly - 1 sets - 10 reps - Serratus Activation at Wall  - 1 x daily - 7 x weekly - 1 sets - 10 reps  Date: 03/21/2023 Prepared by: Becky Sax  Exercises - Correct Seated Posture  - 3-5 x daily - 7 x weekly - 1 sets - 5 reps - 5" hold - Seated Shoulder Rolls  - 2 x daily - 7 x weekly - 1 sets - 10 reps - Seated Transversus Abdominis Bracing  - 2 x daily - 7 x weekly - 2 sets - 10 reps - 5" hold  ASSESSMENT:  CLINICAL IMPRESSION: 03/23/23: Interventions today were geared towards cervical mobility, as well as shoulder mobility and strength. Patient reported a "pop" during the post [facility] glide which alleviated his pain. However, patient heard another "pop" when he tried to do active supine abduction which increased his pain slightly. Assessed GH integrity and it was intact (no subluxation/dislocation noted). Patient tolerated shoulder flex and ER with a dowel well but was unable to tolerate shoulder abd with a dowel due to increased pain to 7-8/10 with frequent popping so only 1 set was performed. Patient was also able to tolerate shoulder flex to 90 deg with weights but was unable to do sidelying shoulder abd to 90 deg with weights so the weight was removed which made him tolerate the activity better. Patient was unable to demonstrate full shoulder flexion with serratus anterior activation. Attempted to  perform, pendulums, lower trapezius activation and sidelying ER at 0 abd but patient was unable due to increased pain. Provided mild amount of cueing to ensure correct execution of activity with fair to good carry-over. To date, skilled PT is required to address the impairments and improve function.    EVAL: Patient is a 38 y.o. male who was seen today for physical therapy evaluation and treatment for low back and shoulder pain. Patient's condition is further defined by difficulty with bending/lifting and overhead activities due to pain, weakness, and decreased soft tissue extensibility. Based on the objective findings above, patient may be presenting s/sx of shoulder impingement. Skilled PT is required to address the impairments and functional limitations listed below.    OBJECTIVE IMPAIRMENTS: decreased mobility, decreased ROM, decreased strength, impaired flexibility, postural dysfunction, and pain.   ACTIVITY LIMITATIONS: carrying, lifting, bending, standing, and bed mobility  PARTICIPATION LIMITATIONS: meal prep, cleaning, laundry, driving, shopping, community activity, occupation, and yard work  PERSONAL FACTORS: Time since onset of injury/illness/exacerbation are also affecting patient's functional outcome.   REHAB POTENTIAL: Good  CLINICAL DECISION MAKING: Stable/uncomplicated  EVALUATION COMPLEXITY: Low   GOALS: Goals reviewed with patient? Yes  SHORT TERM GOALS: Target date: 03/31/23  Pt will demonstrate indep in HEP to facilitate carry-over of skilled services and improve functional outcomes Goal status: INITIAL   LONG TERM GOALS: Target date: 04/14/23  Pt will demonstrate a decrease in modified ODI score by 13-14% to demonstrate significant improvement in ADLs  Baseline: 20% Goal status: INITIAL  2.  Pt will have a decrease in QDASH score by 9 points to demonstrate significant improvement in UE function  Baseline: 52.3/100 Goal status: INITIAL  3.  Pt will  demonstrate increase in LE strength to 4+/5 to facilitate ease and safety in ambulation Baseline: 3+/5 Goal status: INITIAL  4.  Patient will demonstrate increase in UE strength to 4/5 to facilitate ease in ADLs Baseline: 3-/5 Goal status: INITIAL  5.  Pt will be able to lift the arm  overhead with little to slight pain (2-3/10) to facilitate ease in ADLs Baseline: 9/10 Goal status: INITIAL  6.  Pt will be able to stand > 4 hours with little pain (1-2/10) to facilitate ease at work. Baseline:  Goal status: INITIAL  7. Pt will demonstrate [facility] shoulder abd AROM to facilitate ease in ADLs. Baseline 95 deg Goal status: INITIAL  PLAN:  PT FREQUENCY: 2x/week  PT DURATION: 4 weeks  PLANNED INTERVENTIONS: 97164- PT Re-evaluation, 97110-Therapeutic exercises, 97530- Therapeutic activity, 97112- Neuromuscular re-education, 97535- Self Care, [id number]- Manual therapy, 97014- Electrical stimulation (unattended), Patient/Family education, Taping, Dry Needling, Cryotherapy, and Moist heat.  PLAN FOR NEXT SESSION: Continue POC and may progress as tolerated with emphasis on UE, core and LE strengthening.  Tish Frederickson. Khya Halls, PT, DPT, OCS Board-Certified Clinical Specialist in Orthopedic PT PT Compact Privilege # ([place]): [id number] T 03/23/2023, 9:42 AM

## 2023-03-30 ENCOUNTER — Encounter (HOSPITAL_COMMUNITY): Payer: Self-pay

## 2023-03-30 ENCOUNTER — Ambulatory Visit (HOSPITAL_COMMUNITY): Payer: BC Managed Care – PPO | Attending: Family Medicine

## 2023-03-30 DIAGNOSIS — M25511 Pain in right shoulder: Secondary | ICD-10-CM | POA: Insufficient documentation

## 2023-03-30 DIAGNOSIS — G8929 Other chronic pain: Secondary | ICD-10-CM | POA: Insufficient documentation

## 2023-03-30 DIAGNOSIS — M25512 Pain in left shoulder: Secondary | ICD-10-CM | POA: Diagnosis not present

## 2023-03-30 DIAGNOSIS — M5459 Other low back pain: Secondary | ICD-10-CM | POA: Diagnosis not present

## 2023-03-30 NOTE — Therapy (Signed)
 OUTPATIENT PHYSICAL THERAPY  UPPER EXTREMITY/THORACOLUMBAR TREATMENT   Patient Name: Bruce Nguyen MRN: [id number] DOB:1985-09-07, 38 y.o., male Today's Date: 03/30/2023  END OF SESSION:  PT End of Session - 03/30/23 0845     Visit Number 4    Number of Visits 8    Date for PT Re-Evaluation 04/14/23    Authorization Type [employer] PPO (no auth)    PT Start Time [phone]    PT Stop Time 0929    PT Time Calculation (min) 42 min    Activity Tolerance Patient tolerated treatment well;Patient limited by pain    Behavior During Therapy [facility] for tasks assessed/performed            Past Medical History:  Diagnosis Date   ADHD    Asthma    Enlarged heart    OCD (obsessive compulsive disorder)    Past Surgical History:  Procedure Laterality Date   COLONOSCOPY     Patient Active Problem List   Diagnosis Date Noted   Chronic left shoulder pain 03/08/2023   Hypertension 12/06/2022   Hyperlipidemia LDL goal <100 12/06/2022   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  SUBJECTIVE:                                                                                                                                                                                           SUBJECTIVE STATEMENT: 03/23/23:  Current shoulder pain = 4/10 and denies any pain on the low back. Patient wants to focus on his shoulder for today's session. Patient states that he was sore after the last session.  03/30/23:  Reports he had 1 hour sleep this morning, up all night fixing door dog chewed threw and limited by pain today.  Pain scale 7/10 Bil  shoulder Lt (2-3//10) >Rt (7/10)  EVAL: Arrives to the clinic with c/o B shoulder pain (L>R) and low back pain. Patient has some numbness on his B fingers which are very random but mostly when he's driving. Condition on the low back started when he was 20 due to a snow boarding incident. Patient also had a lifting incident when he was 19. Claims that he did not break his back. Condition on the shoulder began when they broke  the knot on his back in a rehabilitation place in [facility] in 2023. Had PT on his low back with helped. Patient did not receive any rx for the shoulder yet. X-ray was done on the shoulder which revealed unremarkable results. Patient was just referred to outpatient PT evaluation and management. Patient is more concerned about his shoulder at this time than his back.  PERTINENT HISTORY:  hernia  PAIN:  Are you having pain? Yes: NPRS scale: 2/10 on back, 6/10 shoulder Pain location: localized on low back, top and front of the shoulders Pain description: sharp pain on the low back, tearing pain Aggravating factors: lifting arm with pain 8-9/10, standing for ~ 4 hours for the low back  Relieving factors: tylenol , ibuprofen   PRECAUTIONS: None  RED FLAGS: None   WEIGHT BEARING RESTRICTIONS: No  FALLS:  Has patient fallen in last 6 months? 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  DIAGNOSTIC FINDINGS:  03/08/23 LUMBAR SPINE - COMPLETE 4+ VIEW   COMPARISON:  None Available.   FINDINGS: There is no evidence of  lumbar spine fracture. Minimal chronic compression deformity of superior endplate T12. Alignment is normal. Intervertebral disc spaces are maintained.   IMPRESSION: No acute fracture or dislocation. Minimal chronic compression deformity of superior endplate T12.  03/08/23 LEFT SHOULDER - 2+ VIEW   COMPARISON:  None Available.   FINDINGS: There is no evidence of fracture or dislocation. There is no evidence of arthropathy or other focal bone abnormality. Soft tissues are unremarkable.  PATIENT SURVEYS:  Modified Oswestry 10/50 = 20%  Quick Dash 52.3%  COGNITION: Overall cognitive status: Within functional limits for tasks assessed     SENSATION: Light touch: WFL on B fingers  MUSCLE LENGTH: Hamstrings: moderate restriction on B Thomas test: moderate restriction on B hip flexors Piriformis: moderate restriction on B Upper trapezius: moderate restriction on B  POSTURE: rounded shoulders, forward head, and anterior pelvic tilt  PALPATION: Grade 1 tenderness on the R greater tuberosity Grade 1 tenderness on general muscle mass of the low back  CERVICAL ROM: WFL on flex/ext, rot on B, and lat flex on B  LUMBAR ROM:   AROM eval  Flexion 100%  Extension   Right lateral flexion   Left lateral flexion   Right rotation   Left rotation    (Blank rows = not tested)  UPPER EXTREMITY AROM Active  Right eval Left eval  Shoulder flexion [facility]  Shoulder extension    Shoulder abduction WFL 95  Shoulder internal rotation WFL 60  Shoulderexternal rotation WFL 40  Elbow flexion [facility] WFL  Elbow extension [facility] WFL  Wrist flex [facility] WFL  Wrist ext [facility] WFL  Grip     UPPER EXTREMITY MMT Active  Right eval Left eval  Shoulder flexion 5 3+  Shoulder extension    Shoulder abduction 4 3-  Shoulder internal rotation 5 3+  Shoulderexternal rotation 5 3+  Elbow flexion 5 5  Elbow extension 5 5  Wrist flex 5 5  Wrist ext 5 5  Grip WFL WFL    LOWER EXTREMITY ROM:  Active  Right eval Left eval  Hip flexion [facility]  Hip extension [facility]  Hip abduction [facility]  Hip adduction    Hip internal rotation    Hip external rotation    Knee flexion [facility] WFL  Knee extension [facility]  Ankle dorsiflexion [facility] WFL  Ankle plantarflexion [facility] WFL  Ankle inversion    Ankle eversion     (Blank rows = not tested)  LOWER EXTREMITY MMT:    MMT Right eval Left eval  Hip flexion 4 4  Hip extension 3+ 4-  Hip abduction 3+ 4-  Hip adduction    Hip internal rotation    Hip external rotation    Knee flexion 4+ 4+  Knee extension 4+ 4+  Ankle dorsiflexion 5 5  Ankle plantarflexion 5 5  Ankle inversion    Ankle eversion     (Blank rows = not tested)  SHOULDER SPECIAL TESTS: (+) L empty can, Neer impingement, Hawkins-Kennedy L lift off; (-) Roos test, AC joint compression test  LUMBAR SPECIAL TESTS:  Straight leg raise test: aggravates low back pain, FABER test: Negative, and Thomas test: Negative  FUNCTIONAL TESTS:  5 times sit to stand: 8.19 sec 2 minute walk test: 593 ft  GAIT: Distance walked: 593 ft Assistive device utilized: None Level of assistance: Complete Independence Comments: done during , WFL  TREATMENT DATE:  03/30/23: Pullies for flexion seated position   Abduction 10x 5 Supine: serratus ant punches 10x Standing: Serratus anterior activation on wall 10x c/o  Pec stretch at doorway 2x 30 Prone: Manual STM to Lt rotator cuff, GH oscillation, distraction with PROM Seated:  Scapular retractoin  Ability to flex 155 pain free   AAROM abd to 145 pain free  ER with RTB  03/23/23 L upper trapezius stretch x 30 x 3 Gr 2 inf and post L GH oscillation x 2' each L scapular mobilization medial and inf/downward glide x 10 x 2 each Supine Shoulder AAROM flex, and ER with a dowel x 10 x 2 each Supine Shoulder AAROM abd and ER with a dowel x 10  Supine shoulder flex to 90 deg x 2 lb x 10 x 2 Sidelying shoulder abd to 90 deg x 10 x  2 Serratus ant activation on wall x 10 x 2  03/21/23: Reviewed goals Educated importance of HEP  Trunk Rotation ROM Lt 28 degrees Rt 30 degrees   Prone MMT Mid trap  L 3- painful   R 3/5 Lower trap L 3-/5 painful   Rt 3/5  Supine: Bridge 10x 5 Decompression 2-5 c/o pulling during scapular retraction 5x 5 UE PVC PROM 5x 5  Seated: Postural awareness Posterior shoulder rolls Seated TrA 10x5     03/17/23  Evaluation and patient education done  PATIENT EDUCATION:  Education details: Educated on the pathoanatomy of low back and shoulder pain pain. Educated on the goals and course of rehab.  Person educated: Patient Education method: Explanation Education comprehension: verbalized understanding  HOME EXERCISE PROGRAM: Access Code: [email or web address] URL: [email or web address] 03/23/2023 - Supine Shoulder Flexion with Dowel  - 1 x daily - 7 x weekly - 1 sets - 10 reps - Supine Shoulder Abduction AAROM with Dowel  - 1 x daily - 7 x weekly - 1 sets - 10 reps - Supine Shoulder External Rotation AAROM with Dowel  - 1 x daily - 7 x weekly - 1 sets - 10 reps - Serratus Activation at Wall  - 1 x daily - 7 x weekly - 1 sets - 10 reps  Date: 03/21/2023 Prepared by: Augustin Mclean  Exercises - Correct Seated Posture  - 3-5 x daily - 7 x weekly - 1 sets - 5 reps - 5 hold - Seated Shoulder Rolls  - 2 x daily - 7 x weekly - 1 sets - 10 reps - Seated Transversus Abdominis Bracing  - 2 x daily - 7 x weekly - 2 sets - 10 reps - 5 hold  ASSESSMENT:  CLINICAL IMPRESSION: 03/30/23:  Pt limited by pain in Lt shoulder and c/o popping with serratus anterior exercises and flexion above 90 degrees.  Added manual STM to Lt rotator cuff area and Lt shoulder with trigger point pain over [facility] joint, reports of pain reduced and improved PROM/AAROM with distraction.   Pt presents with improved ROM and reports of pain resolved following manual.  EOS pt c/o something in Lt eye.      EVAL: Patient is a 38 y.o. male who was seen today for physical therapy evaluation and treatment for low back and shoulder pain. Patient's condition is further defined by difficulty with bending/lifting and overhead activities due to pain, weakness, and decreased soft tissue extensibility. Based on the objective findings above, patient may be presenting s/sx of shoulder impingement. Skilled PT is required to address the impairments and functional limitations listed below.    OBJECTIVE IMPAIRMENTS: decreased mobility, decreased ROM, decreased strength, impaired flexibility, postural dysfunction, and pain.   ACTIVITY LIMITATIONS: carrying, lifting, bending, standing, and bed mobility  PARTICIPATION LIMITATIONS: meal prep, cleaning, laundry, driving, shopping, community activity, occupation, and yard work  PERSONAL FACTORS: Time since onset of injury/illness/exacerbation are also affecting patient's functional outcome.   REHAB POTENTIAL: Good  CLINICAL DECISION MAKING: Stable/uncomplicated  EVALUATION COMPLEXITY: Low   GOALS: Goals reviewed with patient? Yes  SHORT TERM GOALS: Target date: 03/31/23  Pt will demonstrate indep in HEP to facilitate carry-over of skilled services and improve functional outcomes Goal status: INITIAL   LONG TERM GOALS: Target date: 04/14/23  Pt will demonstrate a decrease in modified ODI score by 13-14% to demonstrate significant improvement in ADLs  Baseline: 20% Goal status: INITIAL  2.  Pt will have a decrease in QDASH score by 9 points to demonstrate significant improvement in UE function  Baseline: 52.3/100 Goal status: INITIAL  3.  Pt will demonstrate increase in LE strength to 4+/5 to facilitate ease and safety in ambulation Baseline: 3+/5 Goal status: INITIAL  4.  Patient will demonstrate increase in UE strength to 4/5 to  facilitate ease in ADLs Baseline: 3-/5 Goal status: INITIAL  5.  Pt will be able to lift the arm overhead with little to slight pain (2-3/10) to facilitate ease in ADLs Baseline: 9/10 Goal status: INITIAL  6.  Pt  will be able to stand > 4 hours with little pain (1-2/10) to facilitate ease at work. Baseline:  Goal status: INITIAL  7. Pt will demonstrate [facility] shoulder abd AROM to facilitate ease in ADLs. Baseline 95 deg Goal status: INITIAL  PLAN:  PT FREQUENCY: 2x/week  PT DURATION: 4 weeks  PLANNED INTERVENTIONS: 97164- PT Re-evaluation, 97110-Therapeutic exercises, 97530- Therapeutic activity, 97112- Neuromuscular re-education, 97535- Self Care, [id number]- Manual therapy, 97014- Electrical stimulation (unattended), Patient/Family education, Taping, Dry Needling, Cryotherapy, and Moist heat.  PLAN FOR NEXT SESSION: Continue POC and may progress as tolerated with emphasis on UE, core and LE strengthening.  Augustin Mclean, LPTA/CLT; WILLAIM [phone]  03/30/2023, 10:23 AM

## 2023-03-31 ENCOUNTER — Ambulatory Visit (HOSPITAL_COMMUNITY): Payer: BC Managed Care – PPO | Admitting: Physical Therapy

## 2023-03-31 DIAGNOSIS — M25511 Pain in right shoulder: Secondary | ICD-10-CM | POA: Diagnosis not present

## 2023-03-31 DIAGNOSIS — M25512 Pain in left shoulder: Secondary | ICD-10-CM | POA: Diagnosis not present

## 2023-03-31 DIAGNOSIS — G8929 Other chronic pain: Secondary | ICD-10-CM | POA: Diagnosis not present

## 2023-03-31 DIAGNOSIS — G4733 Obstructive sleep apnea (adult) (pediatric): Secondary | ICD-10-CM | POA: Diagnosis not present

## 2023-03-31 DIAGNOSIS — M5459 Other low back pain: Secondary | ICD-10-CM | POA: Diagnosis not present
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
## 2023-04-04 ENCOUNTER — Encounter (HOSPITAL_COMMUNITY): Payer: BC Managed Care – PPO

## 2023-04-06 ENCOUNTER — Encounter (HOSPITAL_COMMUNITY): Payer: BC Managed Care – PPO

## 2023-04-06 ENCOUNTER — Telehealth (HOSPITAL_COMMUNITY): Payer: Self-pay

## 2023-04-06 NOTE — Telephone Encounter (Signed)
Called patient today for his 1st no show. Patient did not answer and left a voice message. Informed patient about the facility's policies on cancellation/no shows and that he can call if he has questions. Also informed patient about his future appointment.  Tish Frederickson. Kiah Vanalstine, PT, DPT, OCS Board-Certified Clinical Specialist in Orthopedic PT PT Compact Privilege # ([place]): [phone]

## 2023-04-11 ENCOUNTER — Ambulatory Visit (HOSPITAL_COMMUNITY): Payer: BC Managed Care – PPO

## 2023-04-11 ENCOUNTER — Encounter (HOSPITAL_COMMUNITY): Payer: Self-pay

## 2023-04-11 DIAGNOSIS — M5459 Other low back pain: Secondary | ICD-10-CM | POA: Diagnosis not present

## 2023-04-11 DIAGNOSIS — G8929 Other chronic pain: Secondary | ICD-10-CM | POA: Diagnosis not present

## 2023-04-11 DIAGNOSIS — M25511 Pain in right shoulder: Secondary | ICD-10-CM | POA: Diagnosis not present

## 2023-04-11 DIAGNOSIS — M25512 Pain in left shoulder: Secondary | ICD-10-CM | POA: Diagnosis not present

## 2023-04-11 NOTE — Therapy (Signed)
 OUTPATIENT PHYSICAL THERAPY  UPPER EXTREMITY/THORACOLUMBAR TREATMENT   Patient Name: Bruce Nguyen MRN: [id number] DOB:23-Jan-1986, 38 y.o., male Today's Date: 04/11/2023  END OF SESSION:  PT End of Session - 04/11/23 0931     Visit Number 6    Number of Visits 8    Date for PT Re-Evaluation 04/14/23    Authorization Type [employer] PPO (no auth)    PT Start Time [phone]    PT Stop Time 0928    PT Time Calculation (min) 38 min    Activity Tolerance Patient tolerated treatment well;Patient limited by pain    Behavior During Therapy [facility] for tasks assessed/performed              Past Medical History:  Diagnosis Date   ADHD    Asthma    Enlarged heart    OCD (obsessive compulsive disorder)    Past Surgical History:  Procedure Laterality Date   COLONOSCOPY     Patient Active Problem List   Diagnosis Date Noted   Chronic left shoulder pain 03/08/2023   Hypertension 12/06/2022   Hyperlipidemia LDL goal <100 12/06/2022   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  SUBJECTIVE:                                                                                                                                                                                           SUBJECTIVE STATEMENT: 04/11/23:  Reports he slept for 48 hrs straight following a week of no sleep.  Pain scale 6/10 Lt shoulder today.  EVAL: Arrives to the clinic with c/o B shoulder pain (L>R) and low back pain. Patient has some numbness on his B fingers which are very random but mostly when he's driving. Condition on the low back started when he  was 20 due to a snow boarding incident. Patient also had a lifting incident when he was 19. Claims that he "did not break his back". Condition on the shoulder began when they "broke the knot" on his back in a rehabilitation place in [facility] in 2023. Had PT on his low back with helped. Patient did not receive any rx for the shoulder yet. X-ray was done on the shoulder which revealed unremarkable results. Patient was just referred  to outpatient PT evaluation and management. Patient is more concerned about his shoulder at this time than his back.  PERTINENT HISTORY:  hernia  PAIN:  Are you having pain? Yes: NPRS scale: 2/10 on back, 6/10 shoulder Pain location: localized on low back, top and front of the shoulders Pain description: sharp pain on the low back, tearing pain Aggravating factors: lifting arm with pain 8-9/10, standing for ~ 4 hours for the low back  Relieving factors: tylenol, ibuprofen  PRECAUTIONS: None  RED FLAGS: None   WEIGHT BEARING RESTRICTIONS: No  FALLS:  Has patient fallen in last 6 months? 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  DIAGNOSTIC FINDINGS:  03/08/23 LUMBAR SPINE - COMPLETE 4+ VIEW   COMPARISON:  None Available.   FINDINGS: There is no evidence of lumbar spine fracture. Minimal chronic compression deformity of superior endplate T12. Alignment is normal. Intervertebral disc spaces are maintained.   IMPRESSION: No acute fracture or dislocation. Minimal chronic compression deformity of  superior endplate T12.  03/08/23 LEFT SHOULDER - 2+ VIEW   COMPARISON:  None Available.   FINDINGS: There is no evidence of fracture or dislocation. There is no evidence of arthropathy or other focal bone abnormality. Soft tissues are unremarkable.  PATIENT SURVEYS:  Modified Oswestry 10/50 = 20%  Quick Dash 52.3%  COGNITION: Overall cognitive status: Within functional limits for tasks assessed     SENSATION: Light touch: WFL on B fingers  MUSCLE LENGTH: Hamstrings: moderate restriction on B Thomas test: moderate restriction on B hip flexors Piriformis: moderate restriction on B Upper trapezius: moderate restriction on B  POSTURE: rounded shoulders, forward head, and anterior pelvic tilt  PALPATION: Grade 1 tenderness on the R greater tuberosity Grade 1 tenderness on general muscle mass of the low back  CERVICAL ROM: WFL on flex/ext, rot on B, and lat flex on B  LUMBAR ROM:   AROM eval  Flexion 100%  Extension   Right lateral flexion   Left lateral flexion   Right rotation   Left rotation    (Blank rows = not tested)  UPPER EXTREMITY AROM Active  Right eval Left eval Left 04/11/23  Shoulder flexion [facility] 155  Shoulder extension     Shoulder abduction WFL 95 125  Shoulder internal rotation WFL 60   Shoulderexternal rotation WFL 40   Elbow flexion [facility] WFL   Elbow extension [facility] WFL   Wrist flex WFL WFL   Wrist ext [facility] WFL   Grip      UPPER EXTREMITY MMT Active  Right eval Left eval  Shoulder flexion 5 3+  Shoulder extension    Shoulder abduction 4 3-  Shoulder internal rotation 5 3+  Shoulderexternal rotation 5 3+  Elbow flexion 5 5  Elbow extension 5 5  Wrist flex 5 5  Wrist ext 5 5  Grip WFL WFL    LOWER EXTREMITY ROM:     Active  Right eval Left eval  Hip flexion [facility]  Hip extension [facility]  Hip abduction [facility]  Hip adduction    Hip internal  rotation    Hip external rotation    Knee flexion [facility] WFL  Knee extension [facility]   Ankle dorsiflexion [facility]  Ankle plantarflexion [facility] WFL  Ankle inversion    Ankle eversion     (Blank rows = not tested)  LOWER EXTREMITY MMT:    MMT Right eval Left eval  Hip flexion 4 4  Hip extension 3+ 4-  Hip abduction 3+ 4-  Hip adduction    Hip internal rotation    Hip external rotation    Knee flexion 4+ 4+  Knee extension 4+ 4+  Ankle dorsiflexion 5 5  Ankle plantarflexion 5 5  Ankle inversion    Ankle eversion     (Blank rows = not tested)  SHOULDER SPECIAL TESTS: (+) L empty can, Neer impingement, Hawkins-Kennedy L lift off; (-) Roos test, AC joint compression test  LUMBAR SPECIAL TESTS:  Straight leg raise test: aggravates low back pain, FABER test: Negative, and Thomas test: Negative  FUNCTIONAL TESTS:  5 times sit to stand: 8.19 sec 2 minute walk test: 593 ft  GAIT: Distance walked: 593 ft Assistive device utilized: None Level of assistance: Complete Independence Comments: done during , WFL  TREATMENT DATE:  04/11/23: UBE backward 4 minutes level 1 Standing:  BTB diagonals   BTB rows 2x 10  Serratus anterior activation on wall 10x 2 with RTB   Wall arch   Corner stretch 4x 30" Prone: Manual STM to Lt rotator cuff, GH oscillation, distraction with PROM, passive lats stretch  Child's pose 2x 30" Seated: Wback 10x 5"   03/31/23: Pulleys for flexion seated position 2 minutes   Abduction 2 minutes UBE backward 4 minutes level 1 Standing: Serratus anterior activation on wall 10x   Pec stretch at doorway 3x 30" Prone: Manual STM to Lt rotator cuff, GH oscillation, distraction with PROM Seated:  Scapular retraction 10X5" holds  ER with BTB 2X10  03/30/23: Pullies for flexion seated position   Abduction 10x 5" Supine: serratus ant punches 10x Standing: Serratus anterior activation on wall 10x c/o  Pec stretch at doorway 2x 30" Prone: Manual STM to Lt rotator cuff, GH oscillation, distraction with PROM Seated:  Scapular  retractoin  Ability to flex 155 pain free   AAROM abd to 145 pain free  ER with RTB  03/23/23 L upper trapezius stretch x 30" x 3 Gr 2 inf and post L GH oscillation x 2' each L scapular mobilization medial and inf/downward glide x 10 x 2 each Supine Shoulder AAROM flex, and ER with a dowel x 10 x 2 each Supine Shoulder AAROM abd and ER with a dowel x 10  Supine shoulder flex to 90 deg x 2 lb x 10 x 2 Sidelying shoulder abd to 90 deg x 10 x 2 Serratus ant activation on wall x 10 x 2  03/21/23: Reviewed goals Educated importance of HEP  Trunk Rotation ROM Lt 28 degrees Rt 30 degrees   Prone MMT Mid trap  L 3- painful   R 3/5 Lower trap L 3-/5 painful   Rt 3/5  Supine: Bridge 10x 5" Decompression 2-5 c/o pulling during scapular retraction 5x 5" UE PVC PROM 5x 5"  Seated: Postural awareness Posterior shoulder rolls Seated TrA 10x5"     03/17/23  Evaluation and patient education done  PATIENT EDUCATION:  Education details: Educated on the pathoanatomy of low back and shoulder pain pain. Educated on the goals and course of rehab.  Person educated: Patient Education method: Explanation Education comprehension: verbalized understanding  HOME EXERCISE PROGRAM: Access Code: [email or web address] URL: [email or web address] 03/23/2023 - Supine Shoulder Flexion with Dowel  - 1 x daily - 7 x weekly - 1 sets - 10 reps - Supine Shoulder Abduction AAROM with Dowel  - 1 x daily - 7 x weekly - 1 sets - 10 reps - Supine Shoulder External Rotation AAROM with Dowel  - 1 x daily - 7 x weekly - 1 sets - 10 reps - Serratus Activation at Wall  - 1 x daily - 7 x weekly - 1 sets - 10 reps  Date: 03/21/2023 Prepared by: Becky Sax  Exercises - Correct Seated Posture  - 3-5 x daily - 7 x weekly - 1 sets - 5 reps - 5" hold - Seated Shoulder Rolls  - 2 x daily  - 7 x weekly - 1 sets - 10 reps - Seated Transversus Abdominis Bracing  - 2 x daily - 7 x weekly - 2 sets - 10 reps - 5" hold  ASSESSMENT:  CLINICAL IMPRESSION: 04/11/23:  Session focus with shoulder ROM and postural strengthening.  Pt presents with improved AROM with reports of intermittent sharp pain at end range flexion and abduction.  Reports of relief with manual distraction and good tolerance with PROM today.  Reports of tightness in lower back during PROM, added lats stretch with positive feedback.  EVAL: Patient is a 38 y.o. male who was seen today for physical therapy evaluation and treatment for low back and shoulder pain. Patient's condition is further defined by difficulty with bending/lifting and overhead activities due to pain, weakness, and decreased soft tissue extensibility. Based on the objective findings above, patient may be presenting s/sx of shoulder impingement. Skilled PT is required to address the impairments and functional limitations listed below.    OBJECTIVE IMPAIRMENTS: decreased mobility, decreased ROM, decreased strength, impaired flexibility, postural dysfunction, and pain.   ACTIVITY LIMITATIONS: carrying, lifting, bending, standing, and bed mobility  PARTICIPATION LIMITATIONS: meal prep, cleaning, laundry, driving, shopping, community activity, occupation, and yard work  PERSONAL FACTORS: Time since onset of injury/illness/exacerbation are also affecting patient's functional outcome.   REHAB POTENTIAL: Good  CLINICAL DECISION MAKING: Stable/uncomplicated  EVALUATION COMPLEXITY: Low   GOALS: Goals reviewed with patient? Yes  SHORT TERM GOALS: Target date: 03/31/23  Pt will demonstrate indep in HEP to facilitate carry-over of skilled services and improve functional outcomes Goal status: INITIAL   LONG TERM GOALS: Target date: 04/14/23  Pt will demonstrate a decrease in modified ODI score by 13-14% to demonstrate significant improvement in ADLs   Baseline: 20% Goal status: INITIAL  2.  Pt will have a decrease in QDASH score by 9 points to demonstrate significant improvement in UE function  Baseline: 52.3/100 Goal status: INITIAL  3.  Pt will demonstrate increase in LE strength to 4+/5 to facilitate ease and safety in ambulation Baseline: 3+/5 Goal status: INITIAL  4.  Patient will demonstrate increase in UE strength to 4/5 to facilitate ease in ADLs Baseline: 3-/5 Goal status: INITIAL  5.  Pt will be able to lift the arm overhead with little to slight pain (2-3/10) to facilitate ease in ADLs Baseline: 9/10 Goal status: INITIAL  6.  Pt will be able to stand > 4 hours with little pain (1-2/10) to facilitate ease at work. Baseline:  Goal status: INITIAL  7. Pt will demonstrate [facility] shoulder abd AROM to facilitate ease in ADLs. Baseline 95 deg Goal status: INITIAL  PLAN:  PT FREQUENCY: 2x/week  PT DURATION: 4 weeks  PLANNED INTERVENTIONS: 97164- PT Re-evaluation, 97110-Therapeutic exercises, 97530- Therapeutic activity, 97112- Neuromuscular re-education, 97535- Self Care, 16109- Manual therapy, 97014- Electrical stimulation (unattended), Patient/Family education, Taping, Dry Needling, Cryotherapy, and Moist heat.  PLAN FOR NEXT SESSION: Continue POC and may progress as tolerated with emphasis on UE, core and LE strengthening.  Add postural strengthening   Becky Sax, LPTA/CLT; CBIS [phone]  04/11/2023, 12:20 PM

## 2023-04-13 ENCOUNTER — Encounter (HOSPITAL_COMMUNITY): Payer: Self-pay

## 2023-04-13 ENCOUNTER — Ambulatory Visit (HOSPITAL_COMMUNITY): Payer: BC Managed Care – PPO

## 2023-04-13 DIAGNOSIS — M5459 Other low back pain: Secondary | ICD-10-CM

## 2023-04-13 DIAGNOSIS — G8929 Other chronic pain: Secondary | ICD-10-CM | POA: Diagnosis not present

## 2023-04-13 DIAGNOSIS — M25512 Pain in left shoulder: Secondary | ICD-10-CM | POA: Diagnosis not present

## 2023-04-13 DIAGNOSIS — M25511 Pain in right shoulder: Secondary | ICD-10-CM | POA: Diagnosis not present

## 2023-04-13 NOTE — Therapy (Signed)
 OUTPATIENT PHYSICAL THERAPY  UPPER EXTREMITY/THORACOLUMBAR TREATMENT   Patient Name: Bruce Nguyen MRN: [id number] DOB:18-Oct-1985, 38 y.o., male Today's Date: 04/13/2023  END OF SESSION:  PT End of Session - 04/13/23 0849     Visit Number 7    Number of Visits 8    Date for PT Re-Evaluation 04/14/23    Authorization Type [employer] P[employer] (no auth)    PT Start Time [phone]    PT Stop Time 0933    PT Time Calculation (min) 43 min    Activity Tolerance Patient tolerated treatment well    Behavior During Therapy [facility] for tasks assessed/performed              Past Medical History:  Diagnosis Date   ADHD    Asthma    Enlarged heart    OCD (obsessive compulsive disorder)    Past Surgical History:  Procedure Laterality Date   COLONOSCOPY     Patient Active Problem List   Diagnosis Date Noted   Chronic left shoulder pain 03/08/2023   Hypertension 12/06/2022   Hyperlipidemia LDL goal <100 12/06/2022   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  SUBJECTIVE:                                                                                                                                                                                           SUBJECTIVE STATEMENT: 04/13/23:  Reports he felt good following last session, went to work lifting #50lbs for 4 hours.  Continues to have popping with some dull and sharp pain, pain scale 6/10.  EVAL: Arrives to the clinic with c/o B shoulder pain (L>R) and low back pain. Patient has some numbness on his B fingers which are very random but mostly when he's driving. Condition  on the low back started when he was 20 due to a snow boarding incident. Patient also had a lifting incident when he was 19. Claims that he "did not break his back". Condition on the shoulder began when they "broke the knot" on his back in a rehabilitation place in [facility] in 2023. Had PT on his low back with helped. Patient did not receive any rx for the shoulder yet. X-ray was done on the shoulder which revealed  unremarkable results. Patient was just referred to outpatient PT evaluation and management. Patient is more concerned about his shoulder at this time than his back.  PERTINENT HISTORY:  hernia  PAIN:  Are you having pain? Yes: NPRS scale: 6/10 shoulder Pain location: localized on low back, top and front of the shoulders Pain description: sharp pain on the low back, tearing pain Aggravating factors: lifting arm with pain 8-9/10, standing for ~ 4 hours for the low back  Relieving factors: tylenol, ibuprofen  PRECAUTIONS: None  RED FLAGS: None   WEIGHT BEARING RESTRICTIONS: No  FALLS:  Has patient fallen in last 6 months? 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  DIAGNOSTIC FINDINGS:  03/08/23 LUMBAR SPINE - COMPLETE 4+ VIEW   COMPARISON:  None Available.   FINDINGS: There is no evidence of lumbar spine fracture. Minimal chronic compression deformity of superior endplate T12. Alignment is normal. Intervertebral disc spaces are maintained.   IMPRESSION: No acute fracture or dislocation. Minimal chronic  compression deformity of superior endplate T12.  03/08/23 LEFT SHOULDER - 2+ VIEW   COMPARISON:  None Available.   FINDINGS: There is no evidence of fracture or dislocation. There is no evidence of arthropathy or other focal bone abnormality. Soft tissues are unremarkable.  PATIENT SURVEYS:  Modified Oswestry 10/50 = 20%  Quick Dash 52.3%  COGNITION: Overall cognitive status: Within functional limits for tasks assessed     SENSATION: Light touch: WFL on B fingers  MUSCLE LENGTH: Hamstrings: moderate restriction on B Thomas test: moderate restriction on B hip flexors Piriformis: moderate restriction on B Upper trapezius: moderate restriction on B  POSTURE: rounded shoulders, forward head, and anterior pelvic tilt  PALPATION: Grade 1 tenderness on the R greater tuberosity Grade 1 tenderness on general muscle mass of the low back  CERVICAL ROM: WFL on flex/ext, rot on B, and lat flex on B  LUMBAR ROM:   AROM eval  Flexion 100%  Extension   Right lateral flexion   Left lateral flexion   Right rotation   Left rotation    (Blank rows = not tested)  UPPER EXTREMITY AROM Active  Right eval Left eval Left 04/11/23 Left  04/13/23  Shoulder flexion [facility] WFL 155 163  Shoulder extension      Shoulder abduction WFL 95 125 158  Shoulder internal rotation WFL 60    Shoulderexternal rotation WFL 40    Elbow flexion [facility] WFL    Elbow extension [facility] WFL    Wrist flex WFL WFL    Wrist ext [facility] WFL    Grip       UPPER EXTREMITY MMT Active  Right eval Left eval  Shoulder flexion 5 3+  Shoulder extension    Shoulder abduction 4 3-  Shoulder internal rotation 5 3+  Shoulderexternal rotation 5 3+  Elbow flexion 5 5  Elbow extension 5 5  Wrist flex 5 5  Wrist ext 5 5  Grip WFL WFL    LOWER EXTREMITY ROM:     Active  Right eval Left eval  Hip flexion [facility]  Hip  extension [facility]  Hip abduction [facility]  Hip adduction    Hip internal rotation    Hip external  rotation    Knee flexion [facility] WFL  Knee extension [facility]  Ankle dorsiflexion [facility] WFL  Ankle plantarflexion [facility] WFL  Ankle inversion    Ankle eversion     (Blank rows = not tested)  LOWER EXTREMITY MMT:    MMT Right eval Left eval  Hip flexion 4 4  Hip extension 3+ 4-  Hip abduction 3+ 4-  Hip adduction    Hip internal rotation    Hip external rotation    Knee flexion 4+ 4+  Knee extension 4+ 4+  Ankle dorsiflexion 5 5  Ankle plantarflexion 5 5  Ankle inversion    Ankle eversion     (Blank rows = not tested)  SHOULDER SPECIAL TESTS: (+) L empty can, Neer impingement, Hawkins-Kennedy L lift off; (-) Roos test, AC joint compression test  LUMBAR SPECIAL TESTS:  Straight leg raise test: aggravates low back pain, FABER test: Negative, and Thomas test: Negative  FUNCTIONAL TESTS:  5 times sit to stand: 8.19 sec 2 minute walk test: 593 ft  GAIT: Distance walked: 593 ft Assistive device utilized: None Level of assistance: Complete Independence Comments: done during , WFL  TREATMENT DATE:  04/13/23: Pulleys with cervical rotation flexion and abduction 1' each Serratus anterior carry with 2# overhead x 200 ft Wall arch 10x Serratus anterior activation on wall 10x 2 with RTB  Wall pushups 10x 5" holds UBE backwards L2 BTB diagonals 2x 10 BTB Y BTB T (horizontal abduction) 2x 10  Prone: Manual STM to Lt rotator cuff, GH oscillation, distraction with PROM  04/11/23: UBE backward 4 minutes level 1 Standing:  BTB diagonals   BTB rows 2x 10  Serratus anterior activation on wall 10x 2 with RTB   Wall arch   Corner stretch 4x 30" Prone: Manual STM to Lt rotator cuff, GH oscillation, distraction with PROM, passive lats stretch  Child's pose 2x 30" Seated: Wback 10x 5"   03/31/23: Pulleys for flexion seated position 2 minutes   Abduction 2 minutes UBE backward 4 minutes level 1 Standing: Serratus anterior activation on wall 10x   Pec stretch at doorway 3x  30" Prone: Manual STM to Lt rotator cuff, GH oscillation, distraction with PROM Seated:  Scapular retraction 10X5" holds  ER with BTB 2X10  03/30/23: Pullies for flexion seated position   Abduction 10x 5" Supine: serratus ant punches 10x Standing: Serratus anterior activation on wall 10x c/o  Pec stretch at doorway 2x 30" Prone: Manual STM to Lt rotator cuff, GH oscillation, distraction with PROM Seated:  Scapular retractoin  Ability to flex 155 pain free   AAROM abd to 145 pain free  ER with RTB  03/23/23 L upper trapezius stretch x 30" x 3 Gr 2 inf and post L GH oscillation x 2' each L scapular mobilization medial and inf/downward glide x 10 x 2 each Supine Shoulder AAROM flex, and ER with a dowel x 10 x 2 each Supine Shoulder AAROM abd and ER with a dowel x 10  Supine shoulder flex to 90 deg x 2 lb x 10 x 2 Sidelying shoulder abd to 90 deg x 10 x 2 Serratus ant activation on wall x 10 x 2  03/21/23: Reviewed goals Educated importance of HEP  Trunk Rotation ROM Lt 28 degrees Rt 30 degrees   Prone MMT Mid trap  L 3- painful  R 3/5 Lower trap L 3-/5 painful   Rt 3/5  Supine: Bridge 10x 5" Decompression 2-5 c/o pulling during scapular retraction 5x 5" UE PVC PROM 5x 5"  Seated: Postural awareness Posterior shoulder rolls Seated TrA 10x5"     03/17/23  Evaluation and patient education done                                                                                                                               PATIENT EDUCATION:  Education details: Educated on the pathoanatomy of low back and shoulder pain pain. Educated on the goals and course of rehab.  Person educated: Patient Education method: Explanation Education comprehension: verbalized understanding  HOME EXERCISE PROGRAM: Access Code: [email or web address] URL: [email or web address] 03/23/2023 - Supine Shoulder Flexion with Dowel  - 1 x daily - 7 x weekly - 1 sets - 10 reps - Supine Shoulder  Abduction AAROM with Dowel  - 1 x daily - 7 x weekly - 1 sets - 10 reps - Supine Shoulder External Rotation AAROM with Dowel  - 1 x daily - 7 x weekly - 1 sets - 10 reps - Serratus Activation at Wall  - 1 x daily - 7 x weekly - 1 sets - 10 reps  Date: 03/21/2023 Prepared by: Becky Sax  Exercises - Correct Seated Posture  - 3-5 x daily - 7 x weekly - 1 sets - 5 reps - 5" hold - Seated Shoulder Rolls  - 2 x daily - 7 x weekly - 1 sets - 10 reps - Seated Transversus Abdominis Bracing  - 2 x daily - 7 x weekly - 2 sets - 10 reps - 5" hold  ASSESSMENT:  CLINICAL IMPRESSION: 04/13/23:  Continued session focus with shoulder mobility and postural strengthening with additional serratus anterior strengthening.  Pt tolerated well though noted visible fatigue with additional exercises due to weakness.  Improved AROM all directions with minimal reports of pain through session.  EVAL: Patient is a 38 y.o. male who was seen today for physical therapy evaluation and treatment for low back and shoulder pain. Patient's condition is further defined by difficulty with bending/lifting and overhead activities due to pain, weakness, and decreased soft tissue extensibility. Based on the objective findings above, patient may be presenting s/sx of shoulder impingement. Skilled PT is required to address the impairments and functional limitations listed below.    OBJECTIVE IMPAIRMENTS: decreased mobility, decreased ROM, decreased strength, impaired flexibility, postural dysfunction, and pain.   ACTIVITY LIMITATIONS: carrying, lifting, bending, standing, and bed mobility  PARTICIPATION LIMITATIONS: meal prep, cleaning, laundry, driving, shopping, community activity, occupation, and yard work  PERSONAL FACTORS: Time since onset of injury/illness/exacerbation are also affecting patient's functional outcome.   REHAB POTENTIAL: Good  CLINICAL DECISION MAKING: Stable/uncomplicated  EVALUATION COMPLEXITY:  Low   GOALS: Goals reviewed with patient? Yes  SHORT TERM GOALS: Target date: 03/31/23  Pt will demonstrate indep in HEP to facilitate carry-over of skilled  services and improve functional outcomes Goal status: INITIAL   LONG TERM GOALS: Target date: 04/14/23  Pt will demonstrate a decrease in modified ODI score by 13-14% to demonstrate significant improvement in ADLs  Baseline: 20% Goal status: INITIAL  2.  Pt will have a decrease in QDASH score by 9 points to demonstrate significant improvement in UE function  Baseline: 52.3/100 Goal status: INITIAL  3.  Pt will demonstrate increase in LE strength to 4+/5 to facilitate ease and safety in ambulation Baseline: 3+/5 Goal status: INITIAL  4.  Patient will demonstrate increase in UE strength to 4/5 to facilitate ease in ADLs Baseline: 3-/5 Goal status: INITIAL  5.  Pt will be able to lift the arm overhead with little to slight pain (2-3/10) to facilitate ease in ADLs Baseline: 9/10 Goal status: INITIAL  6.  Pt will be able to stand > 4 hours with little pain (1-2/10) to facilitate ease at work. Baseline:  Goal status: INITIAL  7. Pt will demonstrate [facility] shoulder abd AROM to facilitate ease in ADLs. Baseline 95 deg Goal status: INITIAL  PLAN:  PT FREQUENCY: 2x/week  PT DURATION: 4 weeks  PLANNED INTERVENTIONS: 97164- PT Re-evaluation, 97110-Therapeutic exercises, 97530- Therapeutic activity, 97112- Neuromuscular re-education, 97535- Self Care, [id number]- Manual therapy, 97014- Electrical stimulation (unattended), Patient/Family education, Taping, Dry Needling, Cryotherapy, and Moist heat.  PLAN FOR NEXT SESSION: Reassess next session.   Becky Sax, LPTA/CLT; [place] [phone]  04/13/2023, 9:44 AM

## 2023-04-18 ENCOUNTER — Ambulatory Visit (HOSPITAL_COMMUNITY): Payer: BC Managed Care – PPO | Admitting: Physical Therapy

## 2023-04-18 DIAGNOSIS — G8929 Other chronic pain: Secondary | ICD-10-CM

## 2023-04-18 DIAGNOSIS — M25511 Pain in right shoulder: Secondary | ICD-10-CM | POA: Diagnosis not present

## 2023-04-18 DIAGNOSIS — M5459 Other low back pain: Secondary | ICD-10-CM

## 2023-04-18 DIAGNOSIS — M25512 Pain in left shoulder: Secondary | ICD-10-CM | POA: Diagnosis not present

## 2023-04-18 NOTE — Therapy (Signed)
 OUTPATIENT PHYSICAL THERAPY  UPPER EXTREMITY/THORACOLUMBAR TREATMENT   Patient Name: Bruce Nguyen MRN: [id number] DOB:05-Dec-1985, 38 y.o., male Today's Date: 04/18/2023  END OF SESSION:  PT End of Session - 04/18/23 1050     Visit Number 8    Number of Visits 16    Date for PT Re-Evaluation 04/14/23    Authorization Type [employer] P[employer] (no auth)    PT Start Time [phone]   pt late   PT Stop Time 0932    PT Time Calculation (min) 39 min    Activity Tolerance Patient tolerated treatment well    Behavior During Therapy [facility] for tasks assessed/performed               Past Medical History:  Diagnosis Date   ADHD    Asthma    Enlarged heart    OCD (obsessive compulsive disorder)    Past Surgical History:  Procedure Laterality Date   COLONOSCOPY     Patient Active Problem List   Diagnosis Date Noted   Chronic left shoulder pain 03/08/2023   Hypertension 12/06/2022   Hyperlipidemia LDL goal <100 12/06/2022   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  SUBJECTIVE:                                                                                                                                                                                           SUBJECTIVE STATEMENT:  Pt states that his shoulder is bothering him the most.   He has a very busy schedule and has no time to complete any of his exercises.  He works 12 hour shifts for  5 days a week.  He can tell that he is able to lift more at work.      EVAL: Arrives to the clinic with c/o B shoulder pain (L>R) and low back pain. Patient has some numbness  on his B fingers which are very random but mostly when he's driving. Condition on the low back started when he was 20 due to a snow boarding incident. Patient also had a lifting incident when he was 19. Claims that he "did not break his back". Condition on the shoulder began when they "broke the knot" on his back in a rehabilitation  place in [place] in 2023. Had PT on his low back with helped. Patient did not receive any rx for the shoulder yet. X-ray was done on the shoulder which revealed unremarkable results. Patient was just referred to outpatient PT evaluation and management. Patient is more concerned about his shoulder at this time than his back.  PERTINENT HISTORY:  hernia  PAIN:  Are you having pain? Yes: NPRS scale: 4/10 shoulder  was 6 Pain location: localized on low back, top and front of the shoulders Pain description: sharp pain on the low back, tearing pain Aggravating factors: lifting arm with pain 6-7/10  was 8-9 ,  standing for ~ 4 hours for the low back 6/10 Relieving factors: tylenol, ibuprofen  PRECAUTIONS: None  RED FLAGS: None   WEIGHT BEARING RESTRICTIONS: No  FALLS:  Has patient fallen in last 6 months? 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  DIAGNOSTIC FINDINGS:  03/08/23 LUMBAR SPINE - COMPLETE 4+ VIEW   COMPARISON:  None Available.   FINDINGS: There is no evidence of lumbar spine fracture. Minimal chronic compression deformity of superior endplate T12. Alignment is normal. Intervertebral disc  spaces are maintained.   IMPRESSION: No acute fracture or dislocation. Minimal chronic compression deformity of superior endplate T12.  03/08/23 LEFT SHOULDER - 2+ VIEW   COMPARISON:  None Available.   FINDINGS: There is no evidence of fracture or dislocation. There is no evidence of arthropathy or other focal bone abnormality. Soft tissues are unremarkable.  PATIENT SURVEYS:  Modified Oswestry 10/50 = 20% :  2/24: 11/50; 22% Quick Dash 52.3%; 04/18/23:  8.18%  COGNITION: Overall cognitive status: Within functional limits for tasks assessed     SENSATION: Light touch: WFL on B fingers  MUSCLE LENGTH: Hamstrings: moderate restriction on B Thomas test: moderate restriction on B hip flexors Piriformis: moderate restriction on B Upper trapezius: moderate restriction on B  POSTURE: rounded shoulders, forward head, and anterior pelvic tilt  PALPATION: Grade 1 tenderness on the R greater tuberosity Grade 1 tenderness on general muscle mass of the low back  CERVICAL ROM: WFL on flex/ext, rot on B, and lat flex on B  LUMBAR ROM:   AROM eval  Flexion 100%  Extension   Right lateral flexion   Left lateral flexion   Right rotation   Left rotation    (Blank rows = not tested)  UPPER EXTREMITY AROM Active  Right eval Left eval Left 04/11/23 Left  04/13/23 Left  04/18/23  Shoulder flexion [facility] WFL 155 163 170  Shoulder extension       Shoulder abduction WFL 95 125 158 170  Shoulder internal rotation WFL 60   70  Shoulderexternal rotation WFL 40   70  Elbow flexion [facility] WFL     Elbow extension [facility] WFL     Wrist flex [facility] WFL     Wrist ext [facility]     Grip        UPPER EXTREMITY MMT Active  Right eval Left eval Left  04/18/23  Shoulder flexion 5 3+ 4+  Shoulder extension  Shoulder abduction 4 3- 5  Shoulder internal rotation 5 3+ 3+  Shoulderexternal rotation 5 3+ 4  Elbow flexion 5 5   Elbow extension 5 5   Wrist flex 5 5   Wrist ext 5 5   Grip WFL WFL      LOWER EXTREMITY ROM:     Active  Right eval Left eval  Hip flexion [facility]  Hip extension [facility]  Hip abduction [facility]  Hip adduction    Hip internal rotation    Hip external rotation    Knee flexion [facility]  Knee extension [facility]  Ankle dorsiflexion [facility]  Ankle plantarflexion [facility] WFL  Ankle inversion    Ankle eversion     (Blank rows = not tested)  LOWER EXTREMITY MMT:    MMT Right eval 04/18/23 Left eval 04/18/23  Hip flexion 4 4 4 5   Hip extension 3+ 3 4- 3  Hip abduction 3+ 4 4- 3+  Hip adduction      Hip internal rotation      Hip external rotation      Knee flexion 4+ 4- 4+ 4  Knee extension 4+ 5 4+ 5  Ankle dorsiflexion 5  5   Ankle plantarflexion 5  5   Ankle inversion      Ankle eversion       (Blank rows = not tested)  SHOULDER SPECIAL TESTS: (+) L empty can, Neer impingement, Hawkins-Kennedy L lift off; (-) Roos test, AC joint compression test  LUMBAR SPECIAL TESTS:  Straight leg raise test: aggravates low back pain, FABER test: Negative, and Thomas test: Negative  FUNCTIONAL TESTS:  5 times sit to stand: 8.19 sec; 04/18/23:  8.25 2 minute walk test: 593 ft   TREATMENT DATE:  04/18/23 Reassessment see above: Standing theraband exercises using green  Rows x 5 Shoulder extension B x 5 Shoulder flexion Lt x 5 IR LT x 5 ER Lt x 5 Flexion x 5  04/13/23: Pulleys with cervical rotation flexion and abduction 1' each Serratus anterior carry with 2# overhead x 200 ft Wall arch 10x Serratus anterior activation on wall 10x 2 with RTB  Wall pushups 10x 5" holds UBE backwards L2 BTB diagonals 2x 10 BTB Y BTB T (horizontal abduction) 2x 10  Prone: Manual STM to Lt rotator cuff, GH oscillation, distraction with PROM  04/11/23: UBE backward 4 minutes level 1 Standing:  BTB diagonals   BTB rows 2x 10  Serratus anterior activation on wall 10x 2 with RTB   Wall arch   Corner stretch 4x 30" Prone: Manual STM to Lt rotator cuff, GH  oscillation, distraction with PROM, passive lats stretch  Child's pose 2x 30" Seated: Wback 10x 5"   03/31/23: Pulleys for flexion seated position 2 minutes   Abduction 2 minutes UBE backward 4 minutes level 1 Standing: Serratus anterior activation on wall 10x   Pec stretch at doorway 3x 30" Prone: Manual STM to Lt rotator cuff, GH oscillation, distraction with PROM Seated:  Scapular retraction 10X5" holds  ER with BTB 2X10  03/30/23: Pullies for flexion seated position   Abduction 10x 5" Supine: serratus ant punches 10x Standing: Serratus anterior activation on wall 10x c/o  Pec stretch at doorway 2x 30" Prone: Manual STM to Lt rotator cuff, GH oscillation, distraction with PROM Seated:  Scapular retractoin  Ability to flex 155 pain free   AAROM abd to 145 pain free  ER with RTB PATIENT EDUCATION:  Education  details: Educated on the pathoanatomy of low back and shoulder pain pain. Educated on the goals and course of rehab.  Person educated: Patient Education method: Explanation Education comprehension: verbalized understanding  HOME EXERCISE PROGRAM: Access Code: [email or web address] URL: [email or web address] 03/23/2023 - Supine Shoulder Flexion with Dowel  - 1 x daily - 7 x weekly - 1 sets - 10 reps - Supine Shoulder Abduction AAROM with Dowel  - 1 x daily - 7 x weekly - 1 sets - 10 reps - Supine Shoulder External Rotation AAROM with Dowel  - 1 x daily - 7 x weekly - 1 sets - 10 reps - Serratus Activation at Wall  - 1 x daily - 7 x weekly - 1 sets - 10 reps  Date: 03/21/2023 Prepared by: Becky Sax  Exercises - Correct Seated Posture  - 3-5 x daily - 7 x weekly - 1 sets - 5 reps - 5" hold - Seated Shoulder Rolls  - 2 x daily - 7 x weekly - 1 sets - 10 reps - Seated Transversus Abdominis Bracing  - 2 x daily - 7 x weekly - 2 sets - 10 reps - 5" hold  ASSESSMENT:  CLINICAL IMPRESSION: Pt reassessed states his Lt shoulder is much better but not 100%.  He has  had no improvement in his back and legs but we have not been focusing on this . Pt would like to continue with therapy but begin focusing more on his back and legs.  Pt has not met all of his goals but is progressing with them and will benefit from continuing skilled therapy to work on improving LE strength and decreasing back pain.  EVAL: Patient is a 38 y.o. male who was seen today for physical therapy evaluation and treatment for low back and shoulder pain. Patient's condition is further defined by difficulty with bending/lifting and overhead activities due to pain, weakness, and decreased soft tissue extensibility. Based on the objective findings above, patient may be presenting s/sx of shoulder impingement. Skilled PT is required to address the impairments and functional limitations listed below.    OBJECTIVE IMPAIRMENTS: decreased mobility, decreased ROM, decreased strength, impaired flexibility, postural dysfunction, and pain.   ACTIVITY LIMITATIONS: carrying, lifting, bending, standing, and bed mobility  PARTICIPATION LIMITATIONS: meal prep, cleaning, laundry, driving, shopping, community activity, occupation, and yard work  PERSONAL FACTORS: Time since onset of injury/illness/exacerbation are also affecting patient's functional outcome.   REHAB POTENTIAL: Good  CLINICAL DECISION MAKING: Stable/uncomplicated  EVALUATION COMPLEXITY: Low   GOALS: Goals reviewed with patient? Yes  SHORT TERM GOALS: Target date: 03/31/23  Pt will demonstrate indep in HEP to facilitate carry-over of skilled services and improve functional outcomes Goal status: in progress   LONG TERM GOALS: Target date: 04/14/23  Pt will demonstrate a decrease in modified ODI score by 13-14% to demonstrate significant improvement in ADLs  Baseline: 20% Goal status: INITIAL  2.  Pt will have a decrease in QDASH score by 9 points to demonstrate significant improvement in UE function  Baseline: 52.3/100 Goal status:  INITIAL  3.  Pt will demonstrate increase in LE strength to 4+/5 to facilitate ease and safety in ambulation Baseline: 3+/5 Goal status: not met   4.  Patient will demonstrate increase in UE strength to 4/5 to facilitate ease in ADLs Baseline: 3-/5 Goal status: met  5.  Pt will be able to lift the arm overhead with little to slight pain (2-3/10) to facilitate ease in ADLs Baseline: 9/10 Goal status:met  6.  Pt will be able to stand > 4 hours with little pain (1-2/10) to facilitate ease at work. Baseline:  Goal status: not met  7. Pt will demonstrate [facility] shoulder abd AROM to facilitate ease in ADLs. Baseline 95 deg Goal status:met   PLAN:  PT FREQUENCY: 2x/week  PT DURATION: 4 weeks continue 2x week for 4 more weeks.   PLANNED INTERVENTIONS: [id number]- PT Re-evaluation, 97110-Therapeutic exercises, 97530- Therapeutic activity, [id number]- Neuromuscular re-education, 97535- Self Care, [id number]- Manual therapy, 97014- Electrical stimulation (unattended), Patient/Family education, Taping, Dry Needling, Cryotherapy, and Moist heat.  PLAN FOR NEXT SESSION: continue with therapy begin working with low back more than shoulder.     Virgina Organ, PT CLT [phone]  04/18/2023, 10:51 AM

## 2023-04-20 ENCOUNTER — Ambulatory Visit (HOSPITAL_COMMUNITY): Payer: BC Managed Care – PPO

## 2023-04-20 DIAGNOSIS — M25512 Pain in left shoulder: Secondary | ICD-10-CM | POA: Diagnosis not present

## 2023-04-20 DIAGNOSIS — M5459 Other low back pain: Secondary | ICD-10-CM

## 2023-04-20 DIAGNOSIS — G8929 Other chronic pain: Secondary | ICD-10-CM | POA: Diagnosis not present

## 2023-04-20 DIAGNOSIS — M25511 Pain in right shoulder: Secondary | ICD-10-CM | POA: Diagnosis not present

## 2023-04-20 NOTE — Therapy (Signed)
 OUTPATIENT PHYSICAL THERAPY  UPPER EXTREMITY/THORACOLUMBAR TREATMENT   Patient Name: Bruce Nguyen MRN: [id number] DOB:1985-04-28, 38 y.o., male Today's Date: 04/20/2023  END OF SESSION:  PT End of Session - 04/20/23 0904     Visit Number 9    Number of Visits 16    Date for PT Re-Evaluation 04/14/23    Authorization Type [employer] PPO (no auth)    PT Start Time [phone]    PT Stop Time 0930    PT Time Calculation (min) 38 min    Activity Tolerance Patient tolerated treatment well    Behavior During Therapy [facility] for tasks assessed/performed            Past Medical History:  Diagnosis Date   ADHD    Asthma    Enlarged heart    OCD (obsessive compulsive disorder)    Past Surgical History:  Procedure Laterality Date   COLONOSCOPY     Patient Active Problem List   Diagnosis Date Noted   Chronic left shoulder pain 03/08/2023   Hypertension 12/06/2022   Hyperlipidemia LDL goal <100 12/06/2022   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  SUBJECTIVE:                                                                                                                                                                                           SUBJECTIVE STATEMENT:   Doing well today. Still reports of L shoulder pain = 5/10 and denies back pain. However, patient wants to focus on his legs today.  RE-ASSESSMENT 2/24/25Pt states that his shoulder is bothering him the most.   He has a very busy schedule and has no time to complete any of his exercises.  He works 12 hour shifts for  5 days a week.  He can tell that he  is able to lift more at work.      EVAL: Arrives to the clinic with c/o B shoulder pain (L>R) and low back pain. Patient has some numbness on his B fingers which are very random but mostly when he's driving. Condition on the low back started when he was 20 due to a snow boarding incident. Patient also had a lifting incident when he was 19. Claims that he "  did not break his back". Condition on the shoulder began when they "broke the knot" on his back in a rehabilitation place in [facility] in 2023. Had PT on his low back with helped. Patient did not receive any rx for the shoulder yet. X-ray was done on the shoulder which revealed unremarkable results. Patient was just referred to outpatient PT evaluation and management. Patient is more concerned about his shoulder at this time than his back.  PERTINENT HISTORY:  hernia  PAIN:  Are you having pain? Yes: NPRS scale: 4/10 shoulder  was 6 Pain location: localized on low back, top and front of the shoulders Pain description: sharp pain on the low back, tearing pain Aggravating factors: lifting arm with pain 6-7/10  was 8-9 ,  standing for ~ 4 hours for the low back 6/10 Relieving factors: tylenol, ibuprofen  PRECAUTIONS: None  RED FLAGS: None   WEIGHT BEARING RESTRICTIONS: No  FALLS:  Has patient fallen in last 6 months? 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  DIAGNOSTIC FINDINGS:  03/08/23 LUMBAR SPINE - COMPLETE 4+ VIEW   COMPARISON:  None Available.   FINDINGS: There is no  evidence of lumbar spine fracture. Minimal chronic compression deformity of superior endplate T12. Alignment is normal. Intervertebral disc spaces are maintained.   IMPRESSION: No acute fracture or dislocation. Minimal chronic compression deformity of superior endplate T12.  03/08/23 LEFT SHOULDER - 2+ VIEW   COMPARISON:  None Available.   FINDINGS: There is no evidence of fracture or dislocation. There is no evidence of arthropathy or other focal bone abnormality. Soft tissues are unremarkable.  PATIENT SURVEYS:  Modified Oswestry 10/50 = 20% :  2/24: 11/50; 22% Quick Dash 52.3%; 04/18/23:  8.18%  COGNITION: Overall cognitive status: Within functional limits for tasks assessed     SENSATION: Light touch: WFL on B fingers  MUSCLE LENGTH: Hamstrings: moderate restriction on B Thomas test: moderate restriction on B hip flexors Piriformis: moderate restriction on B Upper trapezius: moderate restriction on B  POSTURE: rounded shoulders, forward head, and anterior pelvic tilt  PALPATION: Grade 1 tenderness on the R greater tuberosity Grade 1 tenderness on general muscle mass of the low back  CERVICAL ROM: WFL on flex/ext, rot on B, and lat flex on B  LUMBAR ROM:   AROM eval  Flexion 100%  Extension   Right lateral flexion   Left lateral flexion   Right rotation   Left rotation    (Blank rows = not tested)  UPPER EXTREMITY AROM Active  Right eval Left eval Left 04/11/23 Left  04/13/23 Left  04/18/23  Shoulder flexion [facility] WFL 155 163 170  Shoulder extension       Shoulder abduction WFL 95 125 158 170  Shoulder internal rotation WFL 60   70  Shoulderexternal rotation WFL 40   70  Elbow flexion [facility] WFL     Elbow extension [facility] WFL     Wrist flex WFL WFL     Wrist ext [facility] WFL     Grip        UPPER EXTREMITY  MMT Active  Right eval Left eval Left  04/18/23  Shoulder flexion 5 3+ 4+  Shoulder extension     Shoulder abduction 4 3- 5  Shoulder internal  rotation 5 3+ 3+  Shoulderexternal rotation 5 3+ 4  Elbow flexion 5 5   Elbow extension 5 5   Wrist flex 5 5   Wrist ext 5 5   Grip WFL WFL     LOWER EXTREMITY ROM:     Active  Right eval Left eval  Hip flexion [facility]  Hip extension [facility]  Hip abduction [facility]  Hip adduction    Hip internal rotation    Hip external rotation    Knee flexion [facility]  Knee extension [facility]  Ankle dorsiflexion [facility]  Ankle plantarflexion [facility] WFL  Ankle inversion    Ankle eversion     (Blank rows = not tested)  LOWER EXTREMITY MMT:    MMT Right eval 04/18/23 Left eval 04/18/23  Hip flexion 4 4 4 5   Hip extension 3+ 3 4- 3  Hip abduction 3+ 4 4- 3+  Hip adduction      Hip internal rotation      Hip external rotation      Knee flexion 4+ 4- 4+ 4  Knee extension 4+ 5 4+ 5  Ankle dorsiflexion 5  5   Ankle plantarflexion 5  5   Ankle inversion      Ankle eversion       (Blank rows = not tested)  SHOULDER SPECIAL TESTS: (+) L empty can, Neer impingement, Hawkins-Kennedy L lift off; (-) Roos test, AC joint compression test  LUMBAR SPECIAL TESTS:  Straight leg raise test: aggravates low back pain, FABER test: Negative, and Thomas test: Negative  FUNCTIONAL TESTS:  5 times sit to stand: 8.19 sec; 04/18/23:  8.25 2 minute walk test: 593 ft   TREATMENT DATE:  04/20/23 NuStep, level 1, seat 14, 5' Seated hamstring stretch x 30" x 3 Seated table piriformis stretch x 30" x 3 Mini Bridges x 10 x 2 Mini Bridges with adductor squeeze x 10 x 2 LTR x 1' Double knee-to-chest with physioball x 10 x 2  04/18/23 Reassessment see above: Standing theraband exercises using green  Rows x 5 Shoulder extension B x 5 Shoulder flexion Lt x 5 IR LT x 5 ER Lt x 5 Flexion x 5  04/13/23: Pulleys with cervical rotation flexion and abduction 1' each Serratus anterior carry with 2# overhead x 200 ft Wall arch 10x Serratus anterior activation on wall 10x 2 with RTB  Wall pushups 10x 5"  holds UBE backwards L2 BTB diagonals 2x 10 BTB Y BTB T (horizontal abduction) 2x 10  Prone: Manual STM to Lt rotator cuff, GH oscillation, distraction with PROM  04/11/23: UBE backward 4 minutes level 1 Standing:  BTB diagonals   BTB rows 2x 10  Serratus anterior activation on wall 10x 2 with RTB   Wall arch   Corner stretch 4x 30" Prone: Manual STM to Lt rotator cuff, GH oscillation, distraction with PROM, passive lats stretch  Child's pose 2x 30" Seated: Wback 10x 5"   03/31/23: Pulleys for flexion seated position 2 minutes   Abduction 2 minutes UBE backward 4 minutes level 1 Standing: Serratus anterior activation on wall 10x   Pec stretch at doorway 3x 30" Prone: Manual STM to Lt rotator cuff, GH oscillation, distraction with PROM Seated:  Scapular retraction 10X5" holds  ER with BTB  2X10  03/30/23: Pullies for flexion seated position   Abduction 10x 5" Supine: serratus ant punches 10x Standing: Serratus anterior activation on wall 10x c/o  Pec stretch at doorway 2x 30" Prone: Manual STM to Lt rotator cuff, GH oscillation, distraction with PROM Seated:  Scapular retractoin  Ability to flex 155 pain free   AAROM abd to 145 pain free  ER with RTB PATIENT EDUCATION:  Education details: Educated on the pathoanatomy of low back and shoulder pain pain. Educated on the goals and course of rehab.  Person educated: Patient Education method: Explanation Education comprehension: verbalized understanding  HOME EXERCISE PROGRAM: Access Code: [email or web address] URL: [email or web address] 04/20/23 - Seated Hamstring Stretch  - 1-2 x daily - 7 x weekly - 3 reps - 30 hold - Seated Table Piriformis Stretch  - 1-2 x daily - 7 x weekly - 3 reps - 30 hold - Supine Bridge with Mini Swiss Ball Between Knees  - 1 x daily - 7 x weekly - 2 sets - 10 reps  03/23/2023 - Supine Shoulder Flexion with Dowel  - 1 x daily - 7 x weekly - 1 sets - 10 reps - Supine Shoulder Abduction  AAROM with Dowel  - 1 x daily - 7 x weekly - 1 sets - 10 reps - Supine Shoulder External Rotation AAROM with Dowel  - 1 x daily - 7 x weekly - 1 sets - 10 reps - Serratus Activation at Wall  - 1 x daily - 7 x weekly - 1 sets - 10 reps  Date: 03/21/2023 Prepared by: Becky Sax  Exercises - Correct Seated Posture  - 3-5 x daily - 7 x weekly - 1 sets - 5 reps - 5" hold - Seated Shoulder Rolls  - 2 x daily - 7 x weekly - 1 sets - 10 reps - Seated Transversus Abdominis Bracing  - 2 x daily - 7 x weekly - 2 sets - 10 reps - 5" hold  ASSESSMENT:  CLINICAL IMPRESSION: Interventions today were geared towards LE flexibility, core and LE flexibility. Tolerated all activities with slight low back pain except when doing bridges where patient reported of shooting pain on the side of the thighs. However, patient reported that the shooting pain went away when doing bridges with hip adduction. Demonstrated appropriate levels of fatigue. Provided slight amount of cueing to ensure correct execution of activity with good carry-over. To date, skilled PT is required to address the impairments and improve function.   RE-ASSESSMENT 04/18/23: Pt reassessed states his Lt shoulder is much better but not 100%.  He has had no improvement in his back and legs but we have not been focusing on this . Pt would like to continue with therapy but begin focusing more on his back and legs.  Pt has not met all of his goals but is progressing with them and will benefit from continuing skilled therapy to work on improving LE strength and decreasing back pain.   EVAL: Patient is a 38 y.o. male who was seen today for physical therapy evaluation and treatment for low back and shoulder pain. Patient's condition is further defined by difficulty with bending/lifting and overhead activities due to pain, weakness, and decreased soft tissue extensibility. Based on the objective findings above, patient may be presenting s/sx of shoulder  impingement. Skilled PT is required to address the impairments and functional limitations listed below.    OBJECTIVE IMPAIRMENTS: decreased mobility, decreased ROM, decreased strength, impaired flexibility, postural dysfunction, and pain.  ACTIVITY LIMITATIONS: carrying, lifting, bending, standing, and bed mobility  PARTICIPATION LIMITATIONS: meal prep, cleaning, laundry, driving, shopping, community activity, occupation, and yard work  PERSONAL FACTORS: Time since onset of injury/illness/exacerbation are also affecting patient's functional outcome.   REHAB POTENTIAL: Good  CLINICAL DECISION MAKING: Stable/uncomplicated  EVALUATION COMPLEXITY: Low   GOALS: Goals reviewed with patient? Yes  SHORT TERM GOALS: Target date: 03/31/23  Pt will demonstrate indep in HEP to facilitate carry-over of skilled services and improve functional outcomes Goal status: in progress   LONG TERM GOALS: Target date: 04/14/23  Pt will demonstrate a decrease in modified ODI score by 13-14% to demonstrate significant improvement in ADLs  Baseline: 20% Goal status: INITIAL  2.  Pt will have a decrease in QDASH score by 9 points to demonstrate significant improvement in UE function  Baseline: 52.3/100 Goal status: INITIAL  3.  Pt will demonstrate increase in LE strength to 4+/5 to facilitate ease and safety in ambulation Baseline: 3+/5 Goal status: not met   4.  Patient will demonstrate increase in UE strength to 4/5 to facilitate ease in ADLs Baseline: 3-/5 Goal status: met  5.  Pt will be able to lift the arm overhead with little to slight pain (2-3/10) to facilitate ease in ADLs Baseline: 9/10 Goal status:met  6.  Pt will be able to stand > 4 hours with little pain (1-2/10) to facilitate ease at work. Baseline:  Goal status: not met  7. Pt will demonstrate [facility] shoulder abd AROM to facilitate ease in ADLs. Baseline 95 deg Goal status:met   PLAN:  PT FREQUENCY: 2x/week  PT DURATION: 4  weeks continue 2x week for 4 more weeks.   PLANNED INTERVENTIONS: 97164- PT Re-evaluation, 97110-Therapeutic exercises, 97530- Therapeutic activity, [id number]- Neuromuscular re-education, 97535- Self Care, [id number]- Manual therapy, 97014- Electrical stimulation (unattended), Patient/Family education, Taping, Dry Needling, Cryotherapy, and Moist heat.  PLAN FOR NEXT SESSION: continue with therapy begin working with low back more than shoulder.     Tish Frederickson. Argusta Mcgann, PT, DPT, OCS Board-Certified Clinical Specialist in Orthopedic PT PT Compact Privilege # ([place]): [id number] T 04/20/2023, 9:32 AM

## 2023-04-25 ENCOUNTER — Encounter (HOSPITAL_COMMUNITY): Payer: Self-pay

## 2023-04-25 ENCOUNTER — Ambulatory Visit (HOSPITAL_COMMUNITY): Payer: BC Managed Care – PPO | Attending: Family Medicine

## 2023-04-25 DIAGNOSIS — G8929 Other chronic pain: Secondary | ICD-10-CM | POA: Insufficient documentation

## 2023-04-25 DIAGNOSIS — M25512 Pain in left shoulder: Secondary | ICD-10-CM | POA: Insufficient documentation

## 2023-04-25 DIAGNOSIS — M25511 Pain in right shoulder: Secondary | ICD-10-CM | POA: Insufficient documentation

## 2023-04-25 DIAGNOSIS — M5459 Other low back pain: Secondary | ICD-10-CM | POA: Insufficient documentation

## 2023-04-25 NOTE — Therapy (Signed)
 OUTPATIENT PHYSICAL THERAPY  UPPER EXTREMITY/THORACOLUMBAR TREATMENT   Patient Name: Bruce Nguyen MRN: [id number] DOB:Dec 27, 1985, 38 y.o., male Today's Date: 04/25/2023  END OF SESSION:  PT End of Session - 04/25/23 0858     Visit Number 10    Number of Visits 16    Date for PT Re-Evaluation 04/14/23    Authorization Type [employer] PPO (no auth)    PT Start Time 0900   Late sign in then restroom break   PT Stop Time 0928    PT Time Calculation (min) 28 min    Activity Tolerance Patient tolerated treatment well    Behavior During Therapy [facility] for tasks assessed/performed             Past Medical History:  Diagnosis Date   ADHD    Asthma    Enlarged heart    OCD (obsessive compulsive disorder)    Past Surgical History:  Procedure Laterality Date   COLONOSCOPY     Patient Active Problem List   Diagnosis Date Noted   Chronic left shoulder pain 03/08/2023   Hypertension 12/06/2022   Hyperlipidemia LDL goal <100 12/06/2022   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  SUBJECTIVE:                                                                                                                                                                                           SUBJECTIVE STATEMENT:   Got 2 hours of sleep last night.  Currently has LBP pain scale 4-5/10.  Shoulder feels good today.  RE-ASSESSMENT 2/24/25Pt states that his shoulder is bothering him the most.   He has a very busy schedule and has no time to complete any of his exercises.  He works 12 hour shifts for  5 days a week.  He can tell that  he is able to lift more at work.      EVAL: Arrives to the clinic with c/o B shoulder pain (L>R) and low back pain. Patient has some numbness on his B fingers which are very random but mostly when he's driving. Condition on the low back started when he was 20 due to a snow boarding incident. Patient also had a lifting incident when he was 19.  Claims that he "did not break his back". Condition on the shoulder began when they "broke the knot" on his back in a rehabilitation place in [facility] in 2023. Had PT on his low back with helped. Patient did not receive any rx for the shoulder yet. X-ray was done on the shoulder which revealed unremarkable results. Patient was just referred to outpatient PT evaluation and management. Patient is more concerned about his shoulder at this time than his back.  PERTINENT HISTORY:  hernia  PAIN:  Are you having pain? Yes: NPRS scale: 4/10 shoulder  was 6 Pain location: localized on low back, top and front of the shoulders Pain description: sharp pain on the low back, tearing pain Aggravating factors: lifting arm with pain 6-7/10  was 8-9 ,  standing for ~ 4 hours for the low back 6/10 Relieving factors: tylenol, ibuprofen  PRECAUTIONS: None  RED FLAGS: None   WEIGHT BEARING RESTRICTIONS: No  FALLS:  Has patient fallen in last 6 months? 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  DIAGNOSTIC FINDINGS:  03/08/23 LUMBAR SPINE - COMPLETE 4+ VIEW   COMPARISON:  None Available.   FINDINGS: There is no  evidence of lumbar spine fracture. Minimal chronic compression deformity of superior endplate T12. Alignment is normal. Intervertebral disc spaces are maintained.   IMPRESSION: No acute fracture or dislocation. Minimal chronic compression deformity of superior endplate T12.  03/08/23 LEFT SHOULDER - 2+ VIEW   COMPARISON:  None Available.   FINDINGS: There is no evidence of fracture or dislocation. There is no evidence of arthropathy or other focal bone abnormality. Soft tissues are unremarkable.  PATIENT SURVEYS:  Modified Oswestry 10/50 = 20% :  2/24: 11/50; 22% Quick Dash 52.3%; 04/18/23:  8.18%  COGNITION: Overall cognitive status: Within functional limits for tasks assessed     SENSATION: Light touch: WFL on B fingers  MUSCLE LENGTH: Hamstrings: moderate restriction on B Thomas test: moderate restriction on B hip flexors Piriformis: moderate restriction on B Upper trapezius: moderate restriction on B  POSTURE: rounded shoulders, forward head, and anterior pelvic tilt  PALPATION: Grade 1 tenderness on the R greater tuberosity Grade 1 tenderness on general muscle mass of the low back  CERVICAL ROM: WFL on flex/ext, rot on B, and lat flex on B  LUMBAR ROM:   AROM eval  Flexion 100%  Extension   Right lateral flexion   Left lateral flexion   Right rotation   Left rotation    (Blank rows = not tested)  UPPER EXTREMITY AROM Active  Right eval Left eval Left 04/11/23 Left  04/13/23 Left  04/18/23  Shoulder flexion [facility] WFL 155 163 170  Shoulder extension       Shoulder abduction WFL 95 125 158 170  Shoulder internal rotation WFL 60   70  Shoulderexternal rotation WFL 40   70  Elbow flexion [facility] WFL     Elbow extension [facility] WFL     Wrist flex WFL WFL     Wrist ext [facility] WFL     Grip  UPPER EXTREMITY MMT Active  Right eval Left eval Left  04/18/23  Shoulder flexion 5 3+ 4+  Shoulder extension     Shoulder abduction 4 3- 5  Shoulder internal  rotation 5 3+ 3+  Shoulderexternal rotation 5 3+ 4  Elbow flexion 5 5   Elbow extension 5 5   Wrist flex 5 5   Wrist ext 5 5   Grip WFL WFL     LOWER EXTREMITY ROM:     Active  Right eval Left eval  Hip flexion [facility]  Hip extension [facility]  Hip abduction [facility]  Hip adduction    Hip internal rotation    Hip external rotation    Knee flexion [facility]  Knee extension [facility]  Ankle dorsiflexion [facility]  Ankle plantarflexion [facility] WFL  Ankle inversion    Ankle eversion     (Blank rows = not tested)  LOWER EXTREMITY MMT:    MMT Right eval 04/18/23 Left eval 04/18/23  Hip flexion 4 4 4 5   Hip extension 3+ 3 4- 3  Hip abduction 3+ 4 4- 3+  Hip adduction      Hip internal rotation      Hip external rotation      Knee flexion 4+ 4- 4+ 4  Knee extension 4+ 5 4+ 5  Ankle dorsiflexion 5  5   Ankle plantarflexion 5  5   Ankle inversion      Ankle eversion       (Blank rows = not tested)  SHOULDER SPECIAL TESTS: (+) L empty can, Neer impingement, Hawkins-Kennedy L lift off; (-) Roos test, AC joint compression test  LUMBAR SPECIAL TESTS:  Straight leg raise test: aggravates low back pain, FABER test: Negative, and Thomas test: Negative  FUNCTIONAL TESTS:  5 times sit to stand: 8.19 sec; 04/18/23:  8.25 2 minute walk test: 593 ft   TREATMENT DATE:  04/25/23: Discussed importance of seated posture STS 10x Supine: Mini Bridges with adductor squeeze x 10 x 2 March with ab set 10x 3-5" (cueing for breathing and awareness of lumbar control for NMR TrA activation) SKTC 2x 30" with towel (increased LBP) Isometric hip flexion 10x 3" BLE Hamstring stretch with rope 2x 30" Sidelying: Clam with RTB 10x 2 set  04/20/23 NuStep, level 1, seat 14, 5' Seated hamstring stretch x 30" x 3 Seated table piriformis stretch x 30" x 3 Mini Bridges x 10 x 2 Mini Bridges with adductor squeeze x 10 x 2 LTR x 1' Double knee-to-chest with physioball x 10 x 2  04/18/23 Reassessment see  above: Standing theraband exercises using green  Rows x 5 Shoulder extension B x 5 Shoulder flexion Lt x 5 IR LT x 5 ER Lt x 5 Flexion x 5  04/13/23: Pulleys with cervical rotation flexion and abduction 1' each Serratus anterior carry with 2# overhead x 200 ft Wall arch 10x Serratus anterior activation on wall 10x 2 with RTB  Wall pushups 10x 5" holds UBE backwards L2 BTB diagonals 2x 10 BTB Y BTB T (horizontal abduction) 2x 10  Prone: Manual STM to Lt rotator cuff, GH oscillation, distraction with PROM  04/11/23: UBE backward 4 minutes level 1 Standing:  BTB diagonals   BTB rows 2x 10  Serratus anterior activation on wall 10x 2 with RTB   Wall arch   Corner stretch 4x 30" Prone: Manual STM to Lt rotator cuff, GH oscillation, distraction with PROM, passive lats stretch  Child's  pose 2x 30" Seated: Wback 10x 5"   03/31/23: Pulleys for flexion seated position 2 minutes   Abduction 2 minutes UBE backward 4 minutes level 1 Standing: Serratus anterior activation on wall 10x   Pec stretch at doorway 3x 30" Prone: Manual STM to Lt rotator cuff, GH oscillation, distraction with PROM Seated:  Scapular retraction 10X5" holds  ER with BTB 2X10  03/30/23: Pullies for flexion seated position   Abduction 10x 5" Supine: serratus ant punches 10x Standing: Serratus anterior activation on wall 10x c/o  Pec stretch at doorway 2x 30" Prone: Manual STM to Lt rotator cuff, GH oscillation, distraction with PROM Seated:  Scapular retractoin  Ability to flex 155 pain free   AAROM abd to 145 pain free  ER with RTB PATIENT EDUCATION:  Education details: Educated on the pathoanatomy of low back and shoulder pain pain. Educated on the goals and course of rehab.  Person educated: Patient Education method: Explanation Education comprehension: verbalized understanding  HOME EXERCISE PROGRAM: Access Code: [email or web address] URL: [email or web address] 04/20/23 - Seated Hamstring  Stretch  - 1-2 x daily - 7 x weekly - 3 reps - 30 hold - Seated Table Piriformis Stretch  - 1-2 x daily - 7 x weekly - 3 reps - 30 hold - Supine Bridge with Mini Swiss Ball Between Knees  - 1 x daily - 7 x weekly - 2 sets - 10 reps  03/23/2023 - Supine Shoulder Flexion with Dowel  - 1 x daily - 7 x weekly - 1 sets - 10 reps - Supine Shoulder Abduction AAROM with Dowel  - 1 x daily - 7 x weekly - 1 sets - 10 reps - Supine Shoulder External Rotation AAROM with Dowel  - 1 x daily - 7 x weekly - 1 sets - 10 reps - Serratus Activation at Wall  - 1 x daily - 7 x weekly - 1 sets - 10 reps  Date: 03/21/2023 Prepared by: Becky Sax  Exercises - Correct Seated Posture  - 3-5 x daily - 7 x weekly - 1 sets - 5 reps - 5" hold - Seated Shoulder Rolls  - 2 x daily - 7 x weekly - 1 sets - 10 reps - Seated Transversus Abdominis Bracing  - 2 x daily - 7 x weekly - 2 sets - 10 reps - 5" hold   04/25/23:  - Hooklying Hamstring Stretch with Strap  - 2 x daily - 7 x weekly - 1 sets - 3 reps - 30" hold - Clam with Resistance  - 2 x daily - 7 x weekly - 2 sets - 10 reps - 5" hold  ASSESSMENT:  CLINICAL IMPRESSION: Session focus with core and proximal strengthening.  Added glut strengthening exercises with noted weakness due to limited range and difficulty.  Reports of increased pain following SKTC- DC'd exercise.  Added clam with RTB and supine hamstring stretch to HEP with printout given and verbalized understanding.  Reports of increased musculature fatigue and soreness at EOS.   RE-ASSESSMENT 04/18/23: Pt reassessed states his Lt shoulder is much better but not 100%.  He has had no improvement in his back and legs but we have not been focusing on this . Pt would like to continue with therapy but begin focusing more on his back and legs.  Pt has not met all of his goals but is progressing with them and will benefit from continuing skilled therapy to work on improving LE strength and decreasing back pain.  EVAL: Patient is a 38 y.o. male who was seen today for physical therapy evaluation and treatment for low back and shoulder pain. Patient's condition is further defined by difficulty with bending/lifting and overhead activities due to pain, weakness, and decreased soft tissue extensibility. Based on the objective findings above, patient may be presenting s/sx of shoulder impingement. Skilled PT is required to address the impairments and functional limitations listed below.    OBJECTIVE IMPAIRMENTS: decreased mobility, decreased ROM, decreased strength, impaired flexibility, postural dysfunction, and pain.   ACTIVITY LIMITATIONS: carrying, lifting, bending, standing, and bed mobility  PARTICIPATION LIMITATIONS: meal prep, cleaning, laundry, driving, shopping, community activity, occupation, and yard work  PERSONAL FACTORS: Time since onset of injury/illness/exacerbation are also affecting patient's functional outcome.   REHAB POTENTIAL: Good  CLINICAL DECISION MAKING: Stable/uncomplicated  EVALUATION COMPLEXITY: Low   GOALS: Goals reviewed with patient? Yes  SHORT TERM GOALS: Target date: 03/31/23  Pt will demonstrate indep in HEP to facilitate carry-over of skilled services and improve functional outcomes Goal status: in progress   LONG TERM GOALS: Target date: 04/14/23  Pt will demonstrate a decrease in modified ODI score by 13-14% to demonstrate significant improvement in ADLs  Baseline: 20% Goal status: INITIAL  2.  Pt will have a decrease in QDASH score by 9 points to demonstrate significant improvement in UE function  Baseline: 52.3/100 Goal status: INITIAL  3.  Pt will demonstrate increase in LE strength to 4+/5 to facilitate ease and safety in ambulation Baseline: 3+/5 Goal status: not met   4.  Patient will demonstrate increase in UE strength to 4/5 to facilitate ease in ADLs Baseline: 3-/5 Goal status: met  5.  Pt will be able to lift the arm overhead with little  to slight pain (2-3/10) to facilitate ease in ADLs Baseline: 9/10 Goal status:met  6.  Pt will be able to stand > 4 hours with little pain (1-2/10) to facilitate ease at work. Baseline:  Goal status: not met  7. Pt will demonstrate [facility] shoulder abd AROM to facilitate ease in ADLs. Baseline 95 deg Goal status:met   PLAN:  PT FREQUENCY: 2x/week  PT DURATION: 4 weeks continue 2x week for 4 more weeks.   PLANNED INTERVENTIONS: 97164- PT Re-evaluation, 97110-Therapeutic exercises, 97530- Therapeutic activity, [id number]- Neuromuscular re-education, 97535- Self Care, [id number]- Manual therapy, 97014- Electrical stimulation (unattended), Patient/Family education, Taping, Dry Needling, Cryotherapy, and Moist heat.  PLAN FOR NEXT SESSION: continue with therapy begin working with low back more than shoulder.     Becky Sax, LPTA/CLT; [place] [phone]  04/25/2023, 9:49 AM

## 2023-04-27 ENCOUNTER — Ambulatory Visit (HOSPITAL_COMMUNITY): Payer: BC Managed Care – PPO

## 2023-04-27 DIAGNOSIS — G8929 Other chronic pain: Secondary | ICD-10-CM | POA: Diagnosis not present

## 2023-04-27 DIAGNOSIS — M25512 Pain in left shoulder: Secondary | ICD-10-CM | POA: Diagnosis not present

## 2023-04-27 DIAGNOSIS — M5459 Other low back pain: Secondary | ICD-10-CM | POA: Diagnosis not present

## 2023-04-27 DIAGNOSIS — M25511 Pain in right shoulder: Secondary | ICD-10-CM | POA: Diagnosis not present

## 2023-04-27 NOTE — Therapy (Signed)
 OUTPATIENT PHYSICAL THERAPY  UPPER EXTREMITY/THORACOLUMBAR TREATMENT   Patient Name: Bruce Nguyen MRN: [id number] DOB:05-Jan-1986, 38 y.o., male Today's Date: 04/27/2023  END OF SESSION:  PT End of Session - 04/27/23 0854     Visit Number 11    Number of Visits 16    Date for PT Re-Evaluation 05/16/23    Authorization Type [employer] PPO (no auth)    PT Start Time [id number]   late 7 minutes   PT Stop Time 0930    PT Time Calculation (min) 38 min    Activity Tolerance Patient tolerated treatment well    Behavior During Therapy [facility] for tasks assessed/performed              Past Medical History:  Diagnosis Date   ADHD    Asthma    Enlarged heart    OCD (obsessive compulsive disorder)    Past Surgical History:  Procedure Laterality Date   COLONOSCOPY     Patient Active Problem List   Diagnosis Date Noted   Chronic left shoulder pain 03/08/2023   Hypertension 12/06/2022   Hyperlipidemia LDL goal <100 12/06/2022   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  SUBJECTIVE:                                                                                                                                                                                           SUBJECTIVE STATEMENT:   L shoulder pain = 2/10. Low back pain = 4/10 and reports that it is sore. Patient wants to work on his back and legs today.  RE-ASSESSMENT 2/24/25Pt states that his shoulder is bothering him the most.   He has a very busy schedule and has no time to complete any of his exercises.  He works 12 hour shifts for  5 days a week.  He can  tell that he is able to lift more at work.      EVAL: Arrives to the clinic with c/o B shoulder pain (L>R) and low back pain. Patient has some numbness on his B fingers which are very random but mostly when he's driving. Condition on the low back started when he was 20 due to a snow boarding incident. Patient also had a lifting  incident when he was 19. Claims that he "did not break his back". Condition on the shoulder began when they "broke the knot" on his back in a rehabilitation place in [facility] in 2023. Had PT on his low back with helped. Patient did not receive any rx for the shoulder yet. X-ray was done on the shoulder which revealed unremarkable results. Patient was just referred to outpatient PT evaluation and management. Patient is more concerned about his shoulder at this time than his back.  PERTINENT HISTORY:  hernia  PAIN:  Are you having pain? Yes: NPRS scale: 4/10 shoulder  was 6 Pain location: localized on low back, top and front of the shoulders Pain description: sharp pain on the low back, tearing pain Aggravating factors: lifting arm with pain 6-7/10  was 8-9 ,  standing for ~ 4 hours for the low back 6/10 Relieving factors: tylenol, ibuprofen  PRECAUTIONS: None  RED FLAGS: None   WEIGHT BEARING RESTRICTIONS: No  FALLS:  Has patient fallen in last 6 months? 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  DIAGNOSTIC FINDINGS:  03/08/23 LUMBAR SPINE - COMPLETE 4+ VIEW   COMPARISON:  None Available.    FINDINGS: There is no evidence of lumbar spine fracture. Minimal chronic compression deformity of superior endplate T12. Alignment is normal. Intervertebral disc spaces are maintained.   IMPRESSION: No acute fracture or dislocation. Minimal chronic compression deformity of superior endplate T12.  03/08/23 LEFT SHOULDER - 2+ VIEW   COMPARISON:  None Available.   FINDINGS: There is no evidence of fracture or dislocation. There is no evidence of arthropathy or other focal bone abnormality. Soft tissues are unremarkable.  PATIENT SURVEYS:  Modified Oswestry 10/50 = 20% :  2/24: 11/50; 22% Quick Dash 52.3%; 04/18/23:  8.18%  COGNITION: Overall cognitive status: Within functional limits for tasks assessed     SENSATION: Light touch: WFL on B fingers  MUSCLE LENGTH: Hamstrings: moderate restriction on B Thomas test: moderate restriction on B hip flexors Piriformis: moderate restriction on B Upper trapezius: moderate restriction on B  POSTURE: rounded shoulders, forward head, and anterior pelvic tilt  PALPATION: Grade 1 tenderness on the R greater tuberosity Grade 1 tenderness on general muscle mass of the low back  CERVICAL ROM: WFL on flex/ext, rot on B, and lat flex on B  LUMBAR ROM:   AROM eval  Flexion 100%  Extension   Right lateral flexion   Left lateral flexion   Right rotation   Left rotation    (Blank rows = not tested)  UPPER EXTREMITY AROM Active  Right eval Left eval Left 04/11/23 Left  04/13/23 Left  04/18/23  Shoulder flexion [facility] WFL 155 163 170  Shoulder extension       Shoulder abduction WFL 95 125 158 170  Shoulder internal rotation WFL 60   70  Shoulderexternal rotation WFL 40   70  Elbow flexion [facility] WFL     Elbow extension [facility] WFL     Wrist flex WFL WFL     Wrist ext [facility] WFL     Grip  UPPER EXTREMITY MMT Active  Right eval Left eval Left  04/18/23  Shoulder flexion 5 3+ 4+  Shoulder extension     Shoulder abduction 4 3- 5   Shoulder internal rotation 5 3+ 3+  Shoulderexternal rotation 5 3+ 4  Elbow flexion 5 5   Elbow extension 5 5   Wrist flex 5 5   Wrist ext 5 5   Grip WFL WFL     LOWER EXTREMITY ROM:     Active  Right eval Left eval  Hip flexion [facility]  Hip extension [facility]  Hip abduction [facility]  Hip adduction    Hip internal rotation    Hip external rotation    Knee flexion [facility]  Knee extension [facility]  Ankle dorsiflexion [facility]  Ankle plantarflexion [facility] WFL  Ankle inversion    Ankle eversion     (Blank rows = not tested)  LOWER EXTREMITY MMT:    MMT Right eval 04/18/23 Left eval 04/18/23  Hip flexion 4 4 4 5   Hip extension 3+ 3 4- 3  Hip abduction 3+ 4 4- 3+  Hip adduction      Hip internal rotation      Hip external rotation      Knee flexion 4+ 4- 4+ 4  Knee extension 4+ 5 4+ 5  Ankle dorsiflexion 5  5   Ankle plantarflexion 5  5   Ankle inversion      Ankle eversion       (Blank rows = not tested)  SHOULDER SPECIAL TESTS: (+) L empty can, Neer impingement, Hawkins-Kennedy L lift off; (-) Roos test, AC joint compression test  LUMBAR SPECIAL TESTS:  Straight leg raise test: aggravates low back pain, FABER test: Negative, and Thomas test: Negative  FUNCTIONAL TESTS:  5 times sit to stand: 8.19 sec; 04/18/23:  8.25 2 minute walk test: 593 ft   TREATMENT DATE:  04/27/23 NuStep, level 3, seat 14, 5' Seated hamstring stretch x 30" x 3 Seated table piriformis stretch x 30" x 3 LTR x 15" x 5 Standing hip ext, RTB x 10 x 2 Standing hip abd, RTB x 10 x 2 Modified HEP for the low back (see access code on [email or web address])   04/25/23: Discussed importance of seated posture STS 10x Supine: Mini Bridges with adductor squeeze x 10 x 2 March with ab set 10x 3-5" (cueing for breathing and awareness of lumbar control for NMR TrA activation) SKTC 2x 30" with towel (increased LBP) Isometric hip flexion 10x 3" BLE Hamstring stretch with rope 2x 30" Sidelying: Clam with RTB  10x 2 set  04/20/23 NuStep, level 1, seat 14, 5' Seated hamstring stretch x 30" x 3 Seated table piriformis stretch x 30" x 3 Mini Bridges x 10 x 2 Mini Bridges with adductor squeeze x 10 x 2 LTR x 1' Double knee-to-chest with physioball x 10 x 2  04/18/23 Reassessment see above: Standing theraband exercises using green  Rows x 5 Shoulder extension B x 5 Shoulder flexion Lt x 5 IR LT x 5 ER Lt x 5 Flexion x 5  04/13/23: Pulleys with cervical rotation flexion and abduction 1' each Serratus anterior carry with 2# overhead x 200 ft Wall arch 10x Serratus anterior activation on wall 10x 2 with RTB  Wall pushups 10x 5" holds UBE backwards L2 BTB diagonals 2x 10 BTB Y BTB T (horizontal abduction) 2x 10  Prone: Manual STM to Lt rotator cuff, GH oscillation, distraction with  PROM  04/11/23: UBE backward 4 minutes level 1 Standing:  BTB diagonals   BTB rows 2x 10  Serratus anterior activation on wall 10x 2 with RTB   Wall arch   Corner stretch 4x 30" Prone: Manual STM to Lt rotator cuff, GH oscillation, distraction with PROM, passive lats stretch  Child's pose 2x 30" Seated: Wback 10x 5"   03/31/23: Pulleys for flexion seated position 2 minutes   Abduction 2 minutes UBE backward 4 minutes level 1 Standing: Serratus anterior activation on wall 10x   Pec stretch at doorway 3x 30" Prone: Manual STM to Lt rotator cuff, GH oscillation, distraction with PROM Seated:  Scapular retraction 10X5" holds  ER with BTB 2X10  03/30/23: Pullies for flexion seated position   Abduction 10x 5" Supine: serratus ant punches 10x Standing: Serratus anterior activation on wall 10x c/o  Pec stretch at doorway 2x 30" Prone: Manual STM to Lt rotator cuff, GH oscillation, distraction with PROM Seated:  Scapular retractoin  Ability to flex 155 pain free   AAROM abd to 145 pain free  ER with RTB PATIENT EDUCATION:  Education details: Educated on the pathoanatomy of low back and shoulder  pain pain. Educated on the goals and course of rehab.  Person educated: Patient Education method: Explanation Education comprehension: verbalized understanding  HOME EXERCISE PROGRAM: Access Code: [email or web address] URL: [email or web address] 04/27/2023 - Supine Lower Trunk Rotation  - 1 x daily - 7 x weekly - 5 reps - 15 hold - Standing Hip Extension with Resistance at Ankles and Counter Support  - 1 x daily - 7 x weekly - 2 sets - 10 reps - Standing Hip Abduction with Resistance at Ankles and Counter Support  - 1 x daily - 7 x weekly - 2 sets - 10 reps  04/20/23 - Seated Hamstring Stretch  - 1-2 x daily - 7 x weekly - 3 reps - 30 hold - Seated Table Piriformis Stretch  - 1-2 x daily - 7 x weekly - 3 reps - 30 hold - Supine Bridge with Mini Swiss Ball Between Knees  - 1 x daily - 7 x weekly - 2 sets - 10 reps  03/23/2023 - Supine Shoulder Flexion with Dowel  - 1 x daily - 7 x weekly - 1 sets - 10 reps - Supine Shoulder Abduction AAROM with Dowel  - 1 x daily - 7 x weekly - 1 sets - 10 reps - Supine Shoulder External Rotation AAROM with Dowel  - 1 x daily - 7 x weekly - 1 sets - 10 reps - Serratus Activation at Wall  - 1 x daily - 7 x weekly - 1 sets - 10 reps  Date: 03/21/2023 Prepared by: Becky Sax  Exercises - Correct Seated Posture  - 3-5 x daily - 7 x weekly - 1 sets - 5 reps - 5" hold - Seated Shoulder Rolls  - 2 x daily - 7 x weekly - 1 sets - 10 reps - Seated Transversus Abdominis Bracing  - 2 x daily - 7 x weekly - 2 sets - 10 reps - 5" hold   04/25/23:  - Hooklying Hamstring Stretch with Strap  - 2 x daily - 7 x weekly - 1 sets - 3 reps - 30" hold - Clam with Resistance  - 2 x daily - 7 x weekly - 2 sets - 10 reps - 5" hold  ASSESSMENT:  CLINICAL IMPRESSION: Interventions today were geared towards LE flexibility and strength. Tolerated all activities  with slight low back pain  Demonstrated appropriate levels of fatigue. Provided slight amount of cueing to  ensure correct execution of activity with good carry-over. To date, skilled PT is required to address the impairments and improve function.   RE-ASSESSMENT 04/18/23: Pt reassessed states his Lt shoulder is much better but not 100%.  He has had no improvement in his back and legs but we have not been focusing on this . Pt would like to continue with therapy but begin focusing more on his back and legs.  Pt has not met all of his goals but is progressing with them and will benefit from continuing skilled therapy to work on improving LE strength and decreasing back pain.   EVAL: Patient is a 38 y.o. male who was seen today for physical therapy evaluation and treatment for low back and shoulder pain. Patient's condition is further defined by difficulty with bending/lifting and overhead activities due to pain, weakness, and decreased soft tissue extensibility. Based on the objective findings above, patient may be presenting s/sx of shoulder impingement. Skilled PT is required to address the impairments and functional limitations listed below.    OBJECTIVE IMPAIRMENTS: decreased mobility, decreased ROM, decreased strength, impaired flexibility, postural dysfunction, and pain.   ACTIVITY LIMITATIONS: carrying, lifting, bending, standing, and bed mobility  PARTICIPATION LIMITATIONS: meal prep, cleaning, laundry, driving, shopping, community activity, occupation, and yard work  PERSONAL FACTORS: Time since onset of injury/illness/exacerbation are also affecting patient's functional outcome.   REHAB POTENTIAL: Good  CLINICAL DECISION MAKING: Stable/uncomplicated  EVALUATION COMPLEXITY: Low   GOALS: Goals reviewed with patient? Yes  SHORT TERM GOALS: Target date: 03/31/23  Pt will demonstrate indep in HEP to facilitate carry-over of skilled services and improve functional outcomes Goal status: in progress   LONG TERM GOALS: Target date: 04/14/23  Pt will demonstrate a decrease in modified ODI score  by 13-14% to demonstrate significant improvement in ADLs  Baseline: 20% Goal status: INITIAL  2.  Pt will have a decrease in QDASH score by 9 points to demonstrate significant improvement in UE function  Baseline: 52.3/100 Goal status: INITIAL  3.  Pt will demonstrate increase in LE strength to 4+/5 to facilitate ease and safety in ambulation Baseline: 3+/5 Goal status: not met   4.  Patient will demonstrate increase in UE strength to 4/5 to facilitate ease in ADLs Baseline: 3-/5 Goal status: met  5.  Pt will be able to lift the arm overhead with little to slight pain (2-3/10) to facilitate ease in ADLs Baseline: 9/10 Goal status:met  6.  Pt will be able to stand > 4 hours with little pain (1-2/10) to facilitate ease at work. Baseline:  Goal status: not met  7. Pt will demonstrate [facility] shoulder abd AROM to facilitate ease in ADLs. Baseline 95 deg Goal status:met   PLAN:  PT FREQUENCY: 2x/week  PT DURATION: 4 weeks continue 2x week for 4 more weeks.   PLANNED INTERVENTIONS: 97164- PT Re-evaluation, 97110-Therapeutic exercises, 97530- Therapeutic activity, [id number]- Neuromuscular re-education, 97535- Self Care, [id number]- Manual therapy, 97014- Electrical stimulation (unattended), Patient/Family education, Taping, Dry Needling, Cryotherapy, and Moist heat.  PLAN FOR NEXT SESSION: continue with therapy begin working with low back more than shoulder.    Tish Frederickson. Seanne Chirico, PT, DPT, OCS Board-Certified Clinical Specialist in Orthopedic PT PT Compact Privilege # ([place]): [id number] T 04/27/2023, 9:06 AM

## 2023-04-28 DIAGNOSIS — G4733 Obstructive sleep apnea (adult) (pediatric): Secondary | ICD-10-CM | POA: Diagnosis not present

## 2023-05-02 ENCOUNTER — Encounter (HOSPITAL_COMMUNITY): Payer: BC Managed Care – PPO | Admitting: Physical Therapy

## 2023-05-11 ENCOUNTER — Ambulatory Visit (HOSPITAL_COMMUNITY): Admitting: Physical Therapy

## 2023-05-11 DIAGNOSIS — M5459 Other low back pain: Secondary | ICD-10-CM

## 2023-05-11 DIAGNOSIS — M25512 Pain in left shoulder: Secondary | ICD-10-CM | POA: Diagnosis not present

## 2023-05-11 DIAGNOSIS — M25511 Pain in right shoulder: Secondary | ICD-10-CM | POA: Diagnosis not present

## 2023-05-11 DIAGNOSIS — G8929 Other chronic pain: Secondary | ICD-10-CM | POA: Diagnosis not present

## 2023-05-11 NOTE — Therapy (Signed)
 OUTPATIENT PHYSICAL THERAPY  UPPER EXTREMITY/THORACOLUMBAR TREATMENT/Progress   Patient Name: Bruce Nguyen MRN: [id number] DOB:1985/07/05, 38 y.o., male Today's Date: 05/11/2023  END OF SESSION:  PT End of Session - 05/11/23 1216     Visit Number 12    Number of Visits 16    Date for PT Re-Evaluation 05/16/23    Authorization Type [employer] PPO (no auth)    PT Start Time [phone]    PT Stop Time 0932    PT Time Calculation (min) 40 min    Activity Tolerance Patient tolerated treatment well    Behavior During Therapy [facility] for tasks assessed/performed               Past Medical History:  Diagnosis Date   ADHD    Asthma    Enlarged heart    OCD (obsessive compulsive disorder)    Past Surgical History:  Procedure Laterality Date   COLONOSCOPY     Patient Active Problem List   Diagnosis Date Noted   Chronic left shoulder pain 03/08/2023   Hypertension 12/06/2022   Hyperlipidemia LDL goal <100 12/06/2022   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  SUBJECTIVE:                                                                                                                                                                                           SUBJECTIVE STATEMENT:  Pt states that he is sore he does the easy not time consuming exercises.  Lt shoulder 2-3 Rt shoulder pain = 7/10. Low back pain = 9/10 and reports that it is sore. Patient wants to work on his back and legs today.   EVAL: Arrives to the clinic with c/o B shoulder pain (L>R) and low back pain. Patient has some numbness on his B fingers which are very random but mostly  when he's driving. Condition on the low back started when he was 20 due to a snow boarding incident. Patient also had a lifting incident when he was 19. Claims that he "did not break his back". Condition on the shoulder began when they "broke the knot" on his back in a rehabilitation place in [facility] in 2023. Had PT on his low back with helped. Patient did not receive any rx for the  shoulder yet. X-ray was done on the shoulder which revealed unremarkable results. Patient was just referred to outpatient PT evaluation and management. Patient is more concerned about his shoulder at this time than his back.  PERTINENT HISTORY:  hernia  PAIN:  Are you having pain? Yes: Lt shoulder 2-3 Rt shoulder pain = 7/10. Low back pain = 9/10 Pain location: localized on low back, top and front of the shoulders Pain description: sharp pain on the low back, tearing pain Aggravating factors: lifting arm with pain 6-7/10  was 8-9 ,  standing for ~ 4 hours for the low back 6/10 Relieving factors: tylenol, ibuprofen  PRECAUTIONS: None  RED FLAGS: None   WEIGHT BEARING RESTRICTIONS: No  FALLS:  Has patient fallen in last 6 months? 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  DIAGNOSTIC FINDINGS:  03/08/23 LUMBAR SPINE - COMPLETE 4+ VIEW     IMPRESSION: No acute fracture or dislocation. Minimal chronic compression deformity of superior endplate T12.  03/08/23 LEFT SHOULDER - 2+ VIEW FINDINGS: There is no evidence of fracture or dislocation. There is  no evidence of arthropathy or other focal bone abnormality. Soft tissues are unremarkable.  PATIENT SURVEYS:  Modified Oswestry 10/50 = 20% :  2/24: 11/50; 22% Quick Dash 52.3%; 04/18/23:  8.18%   MUSCLE LENGTH: Hamstrings: moderate restriction on B Thomas test: moderate restriction on B hip flexors Piriformis: moderate restriction on B Upper trapezius: moderate restriction on B  POSTURE: rounded shoulders, forward head, and anterior pelvic tilt  PALPATION: Grade 1 tenderness on the R greater tuberosity Grade 1 tenderness on general muscle mass of the low back  CERVICAL ROM: WFL on flex/ext, rot on B, and lat flex on B  LUMBAR ROM: normal  AROM eval  Flexion 100%  Extension   Right lateral flexion   Left lateral flexion   Right rotation   Left rotation    (Blank rows = not tested)  UPPER EXTREMITY AROM  05/11/23:  B WNL at this time  Active  Right eval Left eval Left 04/11/23 Left  04/13/23 Left  04/18/23  Shoulder flexion [facility] WFL 155 163 170  Shoulder extension       Shoulder abduction WFL 95 125 158 170  Shoulder internal rotation WFL 60   70  Shoulderexternal rotation WFL 40   70  Elbow flexion [facility] WFL     Elbow extension [facility] WFL     Wrist flex [facility] WFL     Wrist ext [facility]     Grip        UPPER EXTREMITY MMT Active  Right eval Left eval Left  04/18/23 Left  3/19  Shoulder flexion 5 3+ 4+ 5  Shoulder extension      Shoulder abduction 4 3- 5 5  Shoulder internal rotation 5 3+ 3+ 4+  Shoulderexternal rotation 5 3+ 4 4+  Elbow flexion 5 5    Elbow extension 5 5    Wrist flex 5 5    Wrist ext 5 5    Grip WFL WFL      LOWER  EXTREMITY ROM:   all WFL   LOWER EXTREMITY MMT:    MMT Right eval 04/18/23 Left eval 04/18/23   Hip flexion 4 4 4 5    Hip extension 3+ 3 4- 3   Hip abduction 3+ 4 4- 3+   Hip adduction       Hip internal rotation       Hip external rotation       Knee flexion 4+ 4- 4+ 4   Knee extension 4+ 5 4+ 5   Ankle dorsiflexion 5  5     Ankle plantarflexion 5  5    Ankle inversion       Ankle eversion        (Blank rows = not tested)  SHOULDER SPECIAL TESTS: (+) L empty can, Neer impingement, Hawkins-Kennedy L lift off; (-) Roos test, AC joint compression test  LUMBAR SPECIAL TESTS:  Straight leg raise test: aggravates low back pain, FABER test: Negative, and Thomas test: Negative  FUNCTIONAL TESTS:  5 times sit to stand: 8.19 sec; 04/18/23:  8.25; 05/11/22:  8.68 05/11/22 30 second sit to stand : 15 ;  19 is poor for age and sex  2 minute walk test: 593 ft   TREATMENT DATE:  05/11/23 Reassessed for UE strength see above  Wall arch x 10 Wall lift off x 10 Green theraband  B shoulder flexion x 10 B shoulder extension x 10  Shoulder abduction Rt x 10 .  Lt x 10 ER  Wall push up x 10  Sit to stand x 15  04/27/23 NuStep, level 3, seat 14, 5' Seated hamstring stretch x 30" x 3 Seated table piriformis stretch x 30" x 3 LTR x 15" x 5 Standing hip ext, RTB x 10 x 2 Standing hip abd, RTB x 10 x 2 Modified HEP for the low back (see access code on [email or web address])  PATIENT EDUCATION:  Education details: Educated on the pathoanatomy of low back and shoulder pain pain. Educated on the goals and course of rehab.  Person educated: Patient Education method: Explanation Education comprehension: verbalized understanding  HOME EXERCISE PROGRAM: Access Code: [email or web address] URL: [email or web address] 05/11/23 Wall push up  04/27/2023 - Supine Lower Trunk Rotation  - 1 x daily - 7 x weekly - 5 reps - 15 hold - Standing Hip Extension with Resistance at Ankles and Counter Support  - 1 x daily - 7 x weekly - 2 sets - 10 reps - Standing Hip Abduction with Resistance at Ankles and Counter Support  - 1 x daily - 7 x weekly - 2 sets - 10 reps  04/20/23 - Seated Hamstring Stretch  - 1-2 x daily - 7 x weekly - 3 reps - 30 hold - Seated Table Piriformis Stretch  - 1-2 x daily - 7 x weekly - 3 reps - 30 hold - Supine Bridge  with Mini Swiss Ball Between Knees  - 1 x daily - 7 x weekly - 2 sets - 10 reps  03/23/2023 - Supine Shoulder Flexion with Dowel  - 1 x daily - 7 x weekly - 1 sets - 10 reps - Supine Shoulder Abduction AAROM with Dowel  - 1 x daily - 7 x weekly - 1 sets - 10 reps - Supine Shoulder External Rotation AAROM with Dowel  - 1 x daily - 7 x weekly - 1 sets - 10 reps - Serratus Activation at Wall  - 1 x daily - 7 x weekly - 1 sets - 10  reps  Date: 03/21/2023 Prepared by: Becky Sax  Exercises - Correct Seated Posture  - 3-5 x daily - 7 x weekly - 1 sets - 5 reps - 5" hold - Seated Shoulder Rolls  - 2 x daily - 7 x weekly - 1 sets - 10 reps - Seated Transversus Abdominis Bracing  - 2 x daily - 7 x weekly - 2 sets - 10 reps - 5" hold   04/25/23:  - Hooklying Hamstring Stretch with Strap  - 2 x daily - 7 x weekly - 1 sets - 3 reps - 30" hold - Clam with Resistance  - 2 x daily - 7 x weekly - 2 sets - 10 reps - 5" hold  ASSESSMENT:  CLINICAL IMPRESSION: Therapist stressed to pt that he is not going to improve if he just completes the easier exercises and only on an occasional basis.  Updated HEP   Provided slight amount of cueing to ensure correct execution of activity with good carry-over. To date, skilled PT is required to address the impairments and improve function.    OBJECTIVE IMPAIRMENTS: decreased mobility, decreased ROM, decreased strength, impaired flexibility, postural dysfunction, and pain.   ACTIVITY LIMITATIONS: carrying, lifting, bending, standing, and bed mobility  PARTICIPATION LIMITATIONS: meal prep, cleaning, laundry, driving, shopping, community activity, occupation, and yard work  PERSONAL FACTORS: Time since onset of injury/illness/exacerbation are also affecting patient's functional outcome.   REHAB POTENTIAL: Good  CLINICAL DECISION MAKING: Stable/uncomplicated  EVALUATION COMPLEXITY: Low   GOALS: Goals reviewed with patient? Yes  SHORT TERM GOALS: Target  date: 03/31/23  Pt will demonstrate indep in HEP to facilitate carry-over of skilled services and improve functional outcomes Goal status: in progress   LONG TERM GOALS: Target date: 04/14/23  Pt will demonstrate a decrease in modified ODI score by 13-14% to demonstrate significant improvement in ADLs  Baseline: 20% Goal status: INITIAL  2.  Pt will have a decrease in QDASH score by 9 points to demonstrate significant improvement in UE function  Baseline: 52.3/100 Goal status: INITIAL  3.  Pt will demonstrate increase in LE strength to 4+/5 to facilitate ease and safety in ambulation Baseline: 3+/5 Goal status: not met   4.  Patient will demonstrate increase in UE strength to 4/5 to facilitate ease in ADLs Baseline: 3-/5 Goal status: met  5.  Pt will be able to lift the arm overhead with little to slight pain (2-3/10) to facilitate ease in ADLs Baseline: 9/10 Goal status:met  6.  Pt will be able to stand > 4 hours with little pain (1-2/10) to facilitate ease at work. Baseline:  Goal status: not met  7. Pt will demonstrate [facility] shoulder abd AROM to facilitate ease in ADLs. Baseline 95 deg Goal status:met   PLAN:  PT FREQUENCY: 2x/week  PT DURATION: 4 weeks continue 2x week for 4 more weeks.   PLANNED INTERVENTIONS: 97164- PT Re-evaluation, 97110-Therapeutic exercises, 97530- Therapeutic activity, [id number]- Neuromuscular re-education, 97535- Self Care, 21308- Manual therapy, 97014- Electrical stimulation (unattended), Patient/Family education, Taping, Dry Needling, Cryotherapy, and Moist heat.  PLAN FOR NEXT SESSION: continue with therapy begin working with low back more than shoulder.   Virgina Organ, PT CLT [phone]

## 2023-05-24 ENCOUNTER — Encounter (HOSPITAL_COMMUNITY)

## 2023-05-26 ENCOUNTER — Encounter (HOSPITAL_COMMUNITY): Admitting: Physical Therapy

## 2023-06-28 ENCOUNTER — Ambulatory Visit: Payer: BC Managed Care – PPO | Admitting: Family Medicine

## 2023-11-18 LAB — LAB REPORT - SCANNED

## 2023-11-21 ENCOUNTER — Ambulatory Visit (INDEPENDENT_AMBULATORY_CARE_PROVIDER_SITE_OTHER): Payer: Self-pay | Admitting: Family Medicine

## 2023-11-21 VITALS — BP 145/98 | HR 96 | Resp 18 | Ht 73.0 in | Wt 381.0 lb

## 2023-11-21 DIAGNOSIS — I872 Venous insufficiency (chronic) (peripheral): Secondary | ICD-10-CM

## 2023-11-21 DIAGNOSIS — K429 Umbilical hernia without obstruction or gangrene: Secondary | ICD-10-CM

## 2023-11-21 DIAGNOSIS — I83811 Varicose veins of right lower extremities with pain: Secondary | ICD-10-CM

## 2023-11-21 DIAGNOSIS — K420 Umbilical hernia with obstruction, without gangrene: Secondary | ICD-10-CM

## 2023-11-21 MED ORDER — TRAMADOL HCL 50 MG PO TABS: 50.0000 mg | ORAL_TABLET | Freq: Three times a day (TID) | ORAL | 0 refills | Status: DC | PRN

## 2023-11-21 NOTE — Progress Notes (Unsigned)
 Established Patient Office Visit  Subjective:  Patient ID: Bruce Nguyen, male    DOB: 07/03/1985  Age: 38 y.o. MRN: [id number]  CC:  Chief Complaint  Patient presents with   Foot Swelling    Pt complains of bilateral foot/ankle pain and swelling for over a week. Was seen in ED on 09/26. 7/10 pain.    Hernia    Pt has new concerns of hernia, new odor and getting larger     HPI Bruce Nguyen is a 38 y.o. male with past medical history of hypertension, varicose veins of lower extremity with pain presents for f/u of  chronic medical conditions.  The patient presents with complaints of  foot swelling and pain, more severe in the right foot compared to the left. He reports associated pulsing and numbness in both feet. Pain is constant at 6/10 at rest and increases to 9/10 with ambulation. He describes the pain as sharp, "like a stick knife twisting" when attempting to walk. Pain fluctuates in severity but remains constant overall. He has been taking Motrin  800 mg, which reduces the pain slightly to about 5-6/10. No swelling is observed or reported today. He notes that the "twin vein" connects to the left foot.  Additionally, the patient reports an umbilical hernia that becomes painful only with lifting objects heavier than 20 lbs.   Past Medical History:  Diagnosis Date   ADHD    Asthma    Enlarged heart    OCD (obsessive compulsive disorder)     Past Surgical History:  Procedure Laterality Date   COLONOSCOPY      Family History  Problem Relation Age of Onset   Diabetes Father    Diabetes Other    Thyroid disease Sister     Social History   Socioeconomic History   Marital status: Married    Spouse name: Not on file   Number of children: Not on file   Years of education: Not on file   Highest education level: Associate degree: occupational, Scientist, product/process development, or vocational program  Occupational History   Not on file  Tobacco Use   Smoking status: Light Smoker    Smokeless tobacco: Never   Tobacco comments:    Every now and then  Vaping Use   Vaping status: Former  Substance and Sexual Activity   Alcohol use: Yes    Comment: social   Drug use: No   Sexual activity: Not on file  Other Topics Concern   Not on file  Social History Narrative   Not on file   Social Drivers of Health   Financial Resource Strain: Low Risk  (11/21/2023)   Overall Financial Resource Strain (CARDIA)    Difficulty of Paying Living Expenses: Not very hard  Food Insecurity: Food Insecurity Present (11/21/2023)   Hunger Vital Sign    Worried About Running Out of Food in the Last Year: Sometimes true    Ran Out of Food in the Last Year: Sometimes true  Transportation Needs: No Transportation Needs (11/21/2023)   PRAPARE - Administrator, Civil Service (Medical): No    Lack of Transportation (Non-Medical): No  Physical Activity: Sufficiently Active (11/21/2023)   Exercise Vital Sign    Days of Exercise per Week: 7 days    Minutes of Exercise per Session: 150+ min  Stress: Stress Concern Present (11/21/2023)   [employer] of Occupational Health - Occupational Stress Questionnaire    Feeling of Stress: Rather much  Social Connections: Moderately Integrated (11/21/2023)  Social Connection and Isolation Panel    Frequency of Communication with Friends and Family: More than three times a week    Frequency of Social Gatherings with Friends and Family: Three times a week    Attends Religious Services: 1 to 4 times per year    Active Member of Clubs or Organizations: No    Attends Engineer, structural: Not on file    Marital Status: Married  Catering manager Violence: Not on file    Outpatient Medications Prior to Visit  Medication Sig Dispense Refill   acetaminophen  (TYLENOL ) 500 MG tablet Take 2 tablets (1,000 mg total) by mouth every 6 (six) hours as needed for mild pain (pain score 1-3) or headache. 30 tablet 1   albuterol  (PROVENTIL   HFA;VENTOLIN  HFA) 108 (90 Base) MCG/ACT inhaler Inhale 1-2 puffs into the lungs every 6 (six) hours as needed for wheezing or shortness of breath.     albuterol  (PROVENTIL ) (2.5 MG/3ML) 0.083% nebulizer solution Take 3 mLs (2.5 mg total) by nebulization every 4 (four) hours as needed for wheezing or shortness of breath. 30 vial 1   atorvastatin (LIPITOR) 20 MG tablet Take 20 mg by mouth daily.     cyclobenzaprine  (FLEXERIL ) 5 MG tablet Take 1 tablet (5 mg total) by mouth at bedtime as needed for muscle spasms. 30 tablet 1   lisinopril (ZESTRIL) 20 MG tablet Take 20 mg by mouth daily.     methocarbamol  (ROBAXIN ) 500 MG tablet Take 1 tablet (500 mg total) by mouth 3 (three) times daily. 90 tablet 1   Vitamin D , Ergocalciferol , (DRISDOL ) 1.25 MG (50000 UNIT) CAPS capsule Take 1 capsule (50,000 Units total) by mouth every 7 (seven) days. 20 capsule 1   No facility-administered medications prior to visit.    Allergies  Allergen Reactions   Iodinated Contrast Media    Iodine Itching    ROS Review of Systems  Constitutional:  Negative for fatigue and fever.  Eyes:  Negative for visual disturbance.  Respiratory:  Negative for chest tightness and shortness of breath.   Cardiovascular:  Negative for chest pain and palpitations.  Neurological:  Negative for dizziness and headaches.      Objective:    Physical Exam HENT:     Head: Normocephalic.     Right Ear: External ear normal.     Left Ear: External ear normal.     Nose: No congestion or rhinorrhea.     Mouth/Throat:     Mouth: Mucous membranes are moist.  Cardiovascular:     Rate and Rhythm: Regular rhythm.     Heart sounds: No murmur heard. Pulmonary:     Effort: No respiratory distress.     Breath sounds: Normal breath sounds.  Abdominal:     Hernia: A hernia (umblicial) is present.  Neurological:     Mental Status: He is alert.     BP (!) 145/98   Pulse 96   Resp 18   Ht 6' 1 (1.854 m)   Wt (!) 381 lb (172.8 kg)    SpO2 97%   BMI 50.27 kg/m  Wt Readings from Last 3 Encounters:  11/21/23 (!) 381 lb (172.8 kg)  03/08/23 (!) 342 lb 1.3 oz (155.2 kg)  12/30/22 (!) 341 lb 11.2 oz (155 kg)    Lab Results  Component Value Date   TSH 2.470 03/08/2023   Lab Results  Component Value Date   WBC 7.8 03/08/2023   HGB 14.0 03/08/2023   HCT 43.6 03/08/2023  MCV 85 03/08/2023   PLT 186 03/08/2023   Lab Results  Component Value Date   NA 138 03/08/2023   K 4.6 03/08/2023   CO2 22 03/08/2023   GLUCOSE 98 03/08/2023   BUN 15 03/08/2023   CREATININE 0.71 (L) 03/08/2023   BILITOT 0.6 03/08/2023   ALKPHOS 80 03/08/2023   AST 15 03/08/2023   ALT 31 03/08/2023   PROT 7.2 03/08/2023   ALBUMIN 4.3 03/08/2023   CALCIUM 9.1 03/08/2023   ANIONGAP 9 01/30/2018   EGFR  11/18/2023     Comment:     >90, ABS BY HIM   Lab Results  Component Value Date   CHOL 175 03/08/2023   Lab Results  Component Value Date   HDL 31 (L) 03/08/2023   Lab Results  Component Value Date   LDLCALC 119 (H) 03/08/2023   Lab Results  Component Value Date   TRIG 137 03/08/2023   Lab Results  Component Value Date   CHOLHDL 5.6 (H) 03/08/2023   Lab Results  Component Value Date   HGBA1C 5.5 03/08/2023      Assessment & Plan:  Umbilical hernia with obstruction, without gangrene Assessment & Plan: Encouraged to avoid heavy lifting, straining, or pushing that increases pressure in your belly. -Maintain a healthy weight to reduce strain on the abdominal wall. -Use good posture and abdominal support when lifting or doing physical chores. -Avoid constipation by eating fiber, drinking enough water, and staying active, so you don't strain during bowel movements.  When You Should Be Concerned (Red Flags) -Sudden or worsening pain in the bulge area -The bulge becomes firm, hard, discolored (red, purple, dark), or swollen  -Nausea, vomiting or not being able to pass gas or stools (signs of bowel obstruction)  -The bulge  that was reducible stops going back in (cannot be pushed back)  -Fever, chills, or other signs of infection -Sudden changes in bowel habits or blood in stool   Varicose veins of right lower extremity with pain Assessment & Plan: Encouraged to follow up with Vascular Surgery for further evaluation and management. -Encouraged to take Tramadol 50 mg every 8 hours PRN for pain relief (use only as needed).  Nonpharmacologic interventions recommended include: -Graduated compression stockings (30-40 mm Hg or as tolerated) to improve venous return. -Leg elevation several times a day (above heart level) to reduce venous pressure and swelling. -Regular walking/exercise, especially calf muscle "pump" exercises (ankle flexion/extension) to aid venous circulation. -Avoid prolonged standing or sitting--take frequent breaks, move your legs, shift weight. -Weight management to reduce pressure on veins. -Skin care and protection, especially in lower legs: keep skin moisturized, inspect for skin breakdown, avoid trauma. -Avoid constrictive clothing that can restrict venous return around the legs or thighs. -Monitor for worsening symptoms such as increased pain, worsening swelling, skin changes (discoloration, ulceration), or signs of deep vein issues.    Venous insufficiency -     traMADol HCl; Take 1 tablet (50 mg total) by mouth every 8 (eight) hours as needed for up to 5 days.  Dispense: 15 tablet; Refill: 0  Umbilical hernia without obstruction and without gangrene Assessment & Plan: Encouraged to avoid heavy lifting, straining, or pushing that increases pressure in your belly. -Maintain a healthy weight to reduce strain on the abdominal wall. -Use good posture and abdominal support when lifting or doing physical chores. -Avoid constipation by eating fiber, drinking enough water, and staying active, so you don't strain during bowel movements.  When You Should Be Concerned (  Red Flags) -Sudden or  worsening pain in the bulge area -The bulge becomes firm, hard, discolored (red, purple, dark), or swollen  -Nausea, vomiting or not being able to pass gas or stools (signs of bowel obstruction)  -The bulge that was reducible stops going back in (cannot be pushed back)  -Fever, chills, or other signs of infection -Sudden changes in bowel habits or blood in stool  Orders: -     Ambulatory referral to General Surgery  Note: This chart has been completed using [employer] software, and while attempts have been made to ensure accuracy, certain words and phrases may not be transcribed as intended.    Follow-up: No follow-ups on file.   Arielys Wandersee  Z Bacchus, FNP

## 2023-11-21 NOTE — Patient Instructions (Addendum)
 I appreciate the opportunity to provide care to you today!   Umbilical Hernia  What You Can Do (Self-Care Advice) -Avoid heavy lifting, straining, or pushing that increases pressure in your belly. -Maintain a healthy weight to reduce strain on the abdominal wall. -Use good posture and abdominal support when lifting or doing physical chores. -Avoid constipation by eating fiber, drinking enough water, and staying active, so you don't strain during bowel movements.  When You Should Be Concerned (Red Flags) -Sudden or worsening pain in the bulge area -The bulge becomes firm, hard, discolored (red, purple, dark), or swollen  -Nausea, vomiting or not being able to pass gas or stools (signs of bowel obstruction)  -The bulge that was reducible stops going back in (cannot be pushed back)  -Fever, chills, or other signs of infection -Sudden changes in bowel habits or blood in stool  Venous Insufficiency  -Please follow up with Vascular Surgery for further evaluation and management. -You may take Tramadol 50 mg every 8 hours PRN for pain relief (use only as needed).  Nonpharmacologic interventions recommended include: -Graduated compression stockings (30-40 mm Hg or as tolerated) to improve venous return. -Leg elevation several times a day (above heart level) to reduce venous pressure and swelling. -Regular walking/exercise, especially calf muscle "pump" exercises (ankle flexion/extension) to aid venous circulation. -Avoid prolonged standing or sitting--take frequent breaks, move your legs, shift weight. -Weight management to reduce pressure on veins. -Skin care and protection, especially in lower legs: keep skin moisturized, inspect for skin breakdown, avoid trauma. -Avoid constrictive clothing that can restrict venous return around the legs or thighs. -Monitor for worsening symptoms such as increased pain, worsening swelling, skin changes (discoloration, ulceration), or signs of deep vein  issues.  Please follow up if your symptoms worsen or fail to improve.  Referrals today- General surgery  Attached with your AVS, you will find valuable resources for self-education. I highly recommend dedicating some time to thoroughly examine them.   Please continue to a heart-healthy diet and increase your physical activities. Try to exercise for at least five days a week.    It was a pleasure to see you and I look forward to continuing to work together on your health and well-being. Please do not hesitate to call the office if you need care or have questions about your care.  In case of emergency, please visit the Emergency Department for urgent care, or contact our clinic at [phone] to schedule an appointment. We're here to help you!   Have a wonderful day and week. With Gratitude, Nixon Kolton MSN, FNP-BC

## 2023-11-24 DIAGNOSIS — K429 Umbilical hernia without obstruction or gangrene: Secondary | ICD-10-CM | POA: Insufficient documentation

## 2023-11-24 NOTE — Assessment & Plan Note (Signed)
 Encouraged to avoid heavy lifting, straining, or pushing that increases pressure in your belly. -Maintain a healthy weight to reduce strain on the abdominal wall. -Use good posture and abdominal support when lifting or doing physical chores. -Avoid constipation by eating fiber, drinking enough water, and staying active, so you don't strain during bowel movements.  When You Should Be Concerned (Red Flags) -Sudden or worsening pain in the bulge area -The bulge becomes firm, hard, discolored (red, purple, dark), or swollen  -Nausea, vomiting or not being able to pass gas or stools (signs of bowel obstruction)  -The bulge that was reducible stops going back in (cannot be pushed back)  -Fever, chills, or other signs of infection -Sudden changes in bowel habits or blood in stool

## 2023-11-24 NOTE — Assessment & Plan Note (Signed)
 Encouraged to follow up with Vascular Surgery for further evaluation and management. -Encouraged to take Tramadol 50 mg every 8 hours PRN for pain relief (use only as needed).  Nonpharmacologic interventions recommended include: -Graduated compression stockings (30-40 mm Hg or as tolerated) to improve venous return. -Leg elevation several times a day (above heart level) to reduce venous pressure and swelling. -Regular walking/exercise, especially calf muscle "pump" exercises (ankle flexion/extension) to aid venous circulation. -Avoid prolonged standing or sitting--take frequent breaks, move your legs, shift weight. -Weight management to reduce pressure on veins. -Skin care and protection, especially in lower legs: keep skin moisturized, inspect for skin breakdown, avoid trauma. -Avoid constrictive clothing that can restrict venous return around the legs or thighs. -Monitor for worsening symptoms such as increased pain, worsening swelling, skin changes (discoloration, ulceration), or signs of deep vein issues.

## 2023-12-06 ENCOUNTER — Encounter: Payer: Self-pay | Admitting: Surgery

## 2023-12-06 ENCOUNTER — Ambulatory Visit (INDEPENDENT_AMBULATORY_CARE_PROVIDER_SITE_OTHER): Payer: Self-pay | Admitting: Surgery

## 2023-12-06 VITALS — BP 158/107 | HR 77 | Temp 98.0°F | Resp 20 | Ht 73.0 in | Wt 384.0 lb

## 2023-12-06 DIAGNOSIS — K429 Umbilical hernia without obstruction or gangrene: Secondary | ICD-10-CM

## 2023-12-08 NOTE — Progress Notes (Signed)
 [facility] History and Physical  Reason for Referral: Umbilical hernia Referring Physician: Meade Gerlach, FNP  Chief Complaint   New Patient (Initial Visit)     Bruce Nguyen is a 38 y.o. male.  HPI: Patient presents for evaluation of an umbilical hernia.  It has been present for at least a year, but he is not sure exactly how long.  He thinks it appears after some heavy lifting.  More recently he has started to have pain associated with it and has been increasing in size.  He has never tried to reduce it but when he lays down it seems to partially reduce.  He is tolerating a diet without nausea and vomiting and having regular bowel movements.  His past medical history significant for hypertension, hyperlipidemia, and fluid retention on diuretics.  He denies history of abdominal surgeries.  He occasionally will smoke cigarettes or consume alcohol.  He denies use of illicit drugs.  Past Medical History:  Diagnosis Date   ADHD    Asthma    Enlarged heart    OCD (obsessive compulsive disorder)     Past Surgical History:  Procedure Laterality Date   COLONOSCOPY      Family History  Problem Relation Age of Onset   Diabetes Father    Diabetes Other    Thyroid disease Sister     Social History   Tobacco Use   Smoking status: Light Smoker   Smokeless tobacco: Never   Tobacco comments:    Every now and then  Vaping Use   Vaping status: Former  Substance Use Topics   Alcohol use: Yes    Comment: social   Drug use: No    Medications: I have reviewed the patient's current medications. Allergies as of 12/06/2023       Reactions   Iodinated Contrast Media    Iodine Itching        Medication List        Accurate as of December 06, 2023 11:59 PM. If you have any questions, ask your nurse or doctor.          STOP taking these medications    atorvastatin 20 MG tablet Commonly known as: LIPITOR Stopped by: Nadalie Laughner A Jentry Warnell    cyclobenzaprine  5 MG tablet Commonly known as: FLEXERIL  Stopped by: Ishitha Roper A Opal Dinning   lisinopril 20 MG tablet Commonly known as: ZESTRIL Stopped by: Taliesin Hartlage A Caileen Veracruz   methocarbamol  500 MG tablet Commonly known as: ROBAXIN  Stopped by: Tamiki Kuba A Coleby Yett   Vitamin D  (Ergocalciferol ) 1.25 MG (50000 UNIT) Caps capsule Commonly known as: DRISDOL  Stopped by: Santiana Glidden A Jishnu Jenniges       TAKE these medications    acetaminophen  500 MG tablet Commonly known as: TYLENOL  Take 2 tablets (1,000 mg total) by mouth every 6 (six) hours as needed for mild pain (pain score 1-3) or headache.   albuterol  108 (90 Base) MCG/ACT inhaler Commonly known as: VENTOLIN  HFA Inhale 1-2 puffs into the lungs every 6 (six) hours as needed for wheezing or shortness of breath.   albuterol  (2.5 MG/3ML) 0.083% nebulizer solution Commonly known as: PROVENTIL  Take 3 mLs (2.5 mg total) by nebulization every 4 (four) hours as needed for wheezing or shortness of breath.         ROS:  Constitutional: negative for chills, fatigue, and fevers Eyes: negative for visual disturbance and pain Ears, nose, mouth, throat, and face: negative for ear drainage, sore throat, and sinus problems Respiratory: negative for cough, wheezing, and  shortness of breath Cardiovascular: negative for chest pain and palpitations Gastrointestinal: positive for abdominal pain and reflux symptoms, negative for nausea and vomiting Genitourinary:negative for dysuria and frequency Integument/breast: negative for dryness and rash Hematologic/lymphatic: negative for bleeding and lymphadenopathy Musculoskeletal:negative for back pain and neck pain Neurological: negative for dizziness and tremors Endocrine: negative for temperature intolerance  Blood pressure (!) 158/107, pulse 77, temperature 98 F (36.7 C), temperature source Oral, resp. rate 20, height 6' 1 (1.854 m), weight (!) 384 lb (174.2 kg), SpO2 93%. Physical  Exam Vitals reviewed.  Constitutional:      Appearance: Normal appearance.  HENT:     Head: Normocephalic and atraumatic.  Eyes:     Extraocular Movements: Extraocular movements intact.     Pupils: Pupils are equal, round, and reactive to light.  Cardiovascular:     Rate and Rhythm: Normal rate and regular rhythm.  Pulmonary:     Effort: Pulmonary effort is normal.     Breath sounds: Normal breath sounds.  Abdominal:     Comments: Abdomen soft, nondistended, no percussion tenderness, mild TTP at umbilical hernia site; no rigidity, guarding, rebound tenderness; umbilical hernia 3 cm in size and unable to be completely reduced  Musculoskeletal:        General: Normal range of motion.     Cervical back: Normal range of motion.  Skin:    General: Skin is warm and dry.  Neurological:     General: No focal deficit present.     Mental Status: He is alert and oriented to person, place, and time.  Psychiatric:        Mood and Affect: Mood normal.        Behavior: Behavior normal.     Results: No results found for this or any previous visit (from the past 48 hours).  No results found.   Assessment & Plan:  Bruce Nguyen is a 38 y.o. male who presents for evaluation of an umbilical hernia.  -I discussed the pathophysiology of umbilical hernias and we discussed the need for recommendations repair.  I discussed with the patient that given his current weight, he is of much higher risk for hernia recurrence.  Patient states that he would like to try to lose a little bit of weight on his own prior to surgery.  He does not want to try any of the new weight loss medications, as he does not feel these will work for him.  He would like to try to schedule his surgery for sometime in December -The risk and benefits of robotic assisted laparoscopic umbilical hernia repair with mesh were discussed including but not limited to bleeding, infection, injury to surrounding structures, need for  additional procedures, and hernia recurrence.  After careful consideration, Bruce Nguyen has decided to proceed with surgery.  -Patient tentatively scheduled for surgery on 12/10 -Information provided to the patient regarding umbilical hernias -Advised that the patient should present to the ED if they begin to have painful nonreducible bulge at his umbilicus, nausea, vomiting, and obstipation  All questions were answered to the satisfaction of the patient and family.  Note: Portions of this report may have been transcribed using voice recognition software. Every effort has been made to ensure accuracy; however, inadvertent computerized transcription errors may still be present.   Dorothyann Brittle, DO [facility] [address] [place], [place] [address] [phone] (office)

## 2023-12-21 ENCOUNTER — Other Ambulatory Visit: Payer: Self-pay | Admitting: Family Medicine

## 2023-12-21 DIAGNOSIS — I872 Venous insufficiency (chronic) (peripheral): Secondary | ICD-10-CM

## 2024-01-25 NOTE — Patient Instructions (Signed)
 Bruce Nguyen  01/25/2024     @PREFPERIOPPHARMACY @   Your procedure is scheduled on 02/01/2024.   Report to Bruce Nguyen at  [phone] A.M.   Call this number if you have problems the morning of surgery:  [phone]  If you experience any cold or flu symptoms such as cough, fever, chills, shortness of breath, etc. between now and your scheduled surgery, please notify us  at the above number.   Remember:  Do not eat after midnight.          Use your nebulizer and your inhaler before you come and bring your rescue inhaler with you.   You may drink clear liquids until  0430 am on 02/01/2024.       Clear liquids allowed are:                    Water, Carbonated beverages (diabetics please choose diet or no sugar options), Black Coffee Only (No creamer, milk or cream, including half & half and powdered creamer), and Clear Sports drink (No red color; diabetics please choose diet or no sugar options)    Take these medicines the morning of surgery with A SIP OF WATER                                                               None.    Do not wear jewelry, make-up or nail polish, including gel polish,  artificial nails, or any other type of covering on natural nails (fingers and  toes).  Do not wear lotions, powders, or perfumes, or deodorant.  Do not shave 48 hours prior to surgery.  Men may shave face and neck.  Do not bring valuables to the hospital.  [facility] is not responsible for any belongings or valuables.  Contacts, dentures or bridgework may not be worn into surgery.  Leave your suitcase in the car.  After surgery it may be brought to your room.  For patients admitted to the hospital, discharge time will be determined by your treatment team.  Patients discharged the day of surgery will not be allowed to drive home and must have someone with them for 24 hours.    Special instructions:   DO NOT smoke tobacco or vape for 24 hours before your procedure.  Please  read over the following fact sheets that you were given. Coughing and Deep Breathing, Surgical Site Infection Prevention, Anesthesia Post-op Instructions, and Care and Recovery After Surgery         Laparoscopic Surgery for Belly Hernias: What to Know After After the procedure, it's common to have pain, discomfort, or soreness. Follow these instructions at home: Medicines Take your medicines only as told. You may need to take steps to help treat or prevent trouble pooping (constipation), such as: Taking medicines to help you poop. Eating foods high in fiber, like beans, whole grains, and fresh fruits and vegetables. Drinking more fluids as told. Ask your health care provider if it's safe to drive or use machines while taking your medicine. Incision care  Take care of the cuts in your belly as told. Make sure you: Wash your hands with soap and water for at least 20 seconds before and after you change your bandage. If you can't use  soap and water, use hand sanitizer. Change your bandage. Leave stitches or skin glue alone. Leave tape strips alone unless you're told to take them off. You may trim the edges of the tape strips if they curl up. Check the cuts on your belly every day for signs of infection. Check for: More redness, swelling, or pain. More fluid or blood. Warmth. Pus or a bad smell. Activity Rest as told. Get up to take short walks at least every 2 hours during the day. This helps you breathe better and keeps your blood flowing. Ask for help if you feel weak or unsteady. Do not take baths, swim, or use a hot tub until you're told it's OK. Ask if you can shower. Ask if it's OK for you to lift. If you were given a sedative, do not drive or use machines until you're told it's safe. A sedative can make you sleepy. Ask what things are safe for you to do at home. Ask when you can go back to work or school. General instructions Hold a pillow over your belly when you cough or  sneeze. This helps with pain. Wear a binder around your belly as told by your provider. Do not smoke, vape, or use nicotine or tobacco. Wear compression stockings to reduce swelling and help prevent blood clots in your legs. You may be asked to continue to do deep breathing exercises at home. This will help to prevent a lung infection. Contact a health care provider if: You have any signs of infection. You have pain that gets worse or does not get better with medicine. You throw up or you feel like throwing up. You have a cough. You have not pooped in 3 days. You are not able to pee. You have a fever. Get help right away if: You have very bad pain in your belly. You throw up every time you eat or drink. You have redness, warmth, or pain in your leg. You have chest pain. You have trouble breathing. These symptoms may be an emergency. Call 911 right away. Do not wait to see if the symptoms will go away. Do not drive yourself to the hospital. This information is not intended to replace advice given to you by your health care provider. Make sure you discuss any questions you have with your health care provider. Document Revised: 08/17/2022 Document Reviewed: 08/17/2022 Elsevier Patient Education  2024 Elsevier Inc.General Anesthesia, Adult, Care After The following information offers guidance on how to care for yourself after your procedure. Your health care provider may also give you more specific instructions. If you have problems or questions, contact your health care provider. What can I expect after the procedure? After the procedure, it is common for people to: Have pain or discomfort at the IV site. Have nausea or vomiting. Have a sore throat or hoarseness. Have trouble concentrating. Feel cold or chills. Feel weak, sleepy, or tired (fatigue). Have soreness and body aches. These can affect parts of the body that were not involved in surgery. Follow these instructions at  home: For the time period you were told by your health care provider:  Rest. Do not participate in activities where you could fall or become injured. Do not drive or use machinery. Do not drink alcohol. Do not take sleeping pills or medicines that cause drowsiness. Do not make important decisions or sign legal documents. Do not take care of children on your own. General instructions Drink enough fluid to keep your urine pale yellow.  If you have sleep apnea, surgery and certain medicines can increase your risk for breathing problems. Follow instructions from your health care provider about wearing your sleep device: Anytime you are sleeping, including during daytime naps. While taking prescription pain medicines, sleeping medicines, or medicines that make you drowsy. Return to your normal activities as told by your health care provider. Ask your health care provider what activities are safe for you. Take over-the-counter and prescription medicines only as told by your health care provider. Do not use any products that contain nicotine or tobacco. These products include cigarettes, chewing tobacco, and vaping devices, such as e-cigarettes. These can delay incision healing after surgery. If you need help quitting, ask your health care provider. Contact a health care provider if: You have nausea or vomiting that does not get better with medicine. You vomit every time you eat or drink. You have pain that does not get better with medicine. You cannot urinate or have bloody urine. You develop a skin rash. You have a fever. Get help right away if: You have trouble breathing. You have chest pain. You vomit blood. These symptoms may be an emergency. Get help right away. Call 911. Do not wait to see if the symptoms will go away. Do not drive yourself to the hospital. Summary After the procedure, it is common to have a sore throat, hoarseness, nausea, vomiting, or to feel weak, sleepy, or  fatigue. For the time period you were told by your health care provider, do not drive or use machinery. Get help right away if you have difficulty breathing, have chest pain, or vomit blood. These symptoms may be an emergency. This information is not intended to replace advice given to you by your health care provider. Make sure you discuss any questions you have with your health care provider. Document Revised: 05/08/2021 Document Reviewed: 05/08/2021 Elsevier Patient Education  2024 Elsevier Inc.How to Use Chlorhexidine at Home in the Shower Chlorhexidine gluconate (CHG) is a germ-killing (antiseptic) wash that's used to clean the skin. It can get rid of the germs that normally live on the skin and can keep them away for about 24 hours. If you're having surgery, you may be told to shower with CHG at home the night before surgery. This can help lower your risk for infection. To use CHG wash in the shower, follow the steps below. Supplies needed: CHG body wash. Clean washcloth. Clean towel. How to use CHG in the shower Follow these steps unless you're told to use CHG in a different way: Start the shower. Use your normal soap and shampoo to wash your face and hair. Turn off the shower or move out of the shower stream. Pour CHG onto a clean washcloth. Do not use any type of brush or rough sponge. Start at your neck, washing your body down to your toes. Make sure you: Wash the part of your body where the surgery will be done for at least 1 minute. Do not scrub. Do not use CHG on your head or face unless your health care provider tells you to. If it gets into your ears or eyes, rinse them well with water. Do not wash your genitals with CHG. Wash your back and under your arms. Make sure to wash skin folds. Let the CHG sit on your skin for 1-2 minutes or as long as told. Rinse your entire body in the shower, including all body creases and folds. Turn off the shower. Dry off with a clean  towel. Do not put anything on your skin afterward, such as powder, lotion, or perfume. Put on clean clothes or pajamas. If it's the night before surgery, sleep in clean sheets. General tips Use CHG only as told, and follow the instructions on the label. Use the full amount of CHG as told. This is often one bottle. Do not smoke and stay away from flames after using CHG. Your skin may feel sticky after using CHG. This is normal. The sticky feeling will go away as the CHG dries. Do not use CHG: If you have a chlorhexidine allergy or have reacted to chlorhexidine in the past. On open wounds or areas of skin that have broken skin, cuts, or scrapes. On babies younger than 90 months of age. Contact a health care provider if: You have questions about using CHG. Your skin gets irritated or itchy. You have a rash after using CHG. You swallow any CHG. Call your local poison control center [phone] in the [place] Your eyes itch badly, or they become very red or swollen. Your hearing changes. You have trouble seeing. If you can't reach your provider, go to an urgent care or emergency room. Do not drive yourself. Get help right away if: You have swelling or tingling in your mouth or throat. You make high-pitched whistling sounds when you breathe, most often when you breathe out (wheeze). You have trouble breathing. These symptoms may be an emergency. Call 911 right away. Do not wait to see if the symptoms will go away. Do not drive yourself to the hospital. This information is not intended to replace advice given to you by your health care provider. Make sure you discuss any questions you have with your health care provider. Document Revised: 08/24/2022 Document Reviewed: 08/20/2021 Elsevier Patient Education  2024 [employer].

## 2024-01-27 ENCOUNTER — Encounter (HOSPITAL_COMMUNITY): Payer: Self-pay

## 2024-01-27 ENCOUNTER — Encounter (HOSPITAL_COMMUNITY)
Admission: RE | Admit: 2024-01-27 | Discharge: 2024-01-27 | Disposition: A | Payer: Self-pay | Source: Ambulatory Visit | Attending: Surgery

## 2024-01-27 DIAGNOSIS — I10 Essential (primary) hypertension: Secondary | ICD-10-CM

## 2024-01-27 NOTE — Pre-Procedure Instructions (Signed)
 Attempted to call Patient because he did not show for his pre-op. Left him a detailed VM about NPO and arrival time the day of surgery. Told him to call us  on Monday wit questions.

## 2024-01-30 NOTE — Pre-Procedure Instructions (Signed)
 Attempted pre-op phone call. Left VM for him to call back, with any questions.

## 2024-01-31 NOTE — Progress Notes (Signed)
 Left patient a voicemail to be here at 6:00 AM for surgery on 02/01/2024

## 2024-02-01 ENCOUNTER — Encounter (HOSPITAL_COMMUNITY): Admission: RE | Disposition: A | Payer: Self-pay | Source: Home / Self Care | Attending: Surgery

## 2024-02-01 ENCOUNTER — Ambulatory Visit (HOSPITAL_COMMUNITY)
Admission: RE | Admit: 2024-02-01 | Discharge: 2024-02-01 | Disposition: A | Payer: Self-pay | Attending: Surgery | Admitting: Surgery

## 2024-02-01 ENCOUNTER — Encounter (HOSPITAL_COMMUNITY): Payer: Self-pay | Admitting: Surgery

## 2024-02-01 ENCOUNTER — Telehealth (INDEPENDENT_AMBULATORY_CARE_PROVIDER_SITE_OTHER): Payer: Self-pay | Admitting: Surgery

## 2024-02-01 ENCOUNTER — Ambulatory Visit (HOSPITAL_COMMUNITY): Payer: Self-pay | Admitting: Certified Registered"

## 2024-02-01 ENCOUNTER — Encounter (HOSPITAL_COMMUNITY): Payer: Self-pay

## 2024-02-01 ENCOUNTER — Other Ambulatory Visit: Payer: Self-pay

## 2024-02-01 ENCOUNTER — Emergency Department (HOSPITAL_COMMUNITY)
Admission: EM | Admit: 2024-02-01 | Discharge: 2024-02-02 | Disposition: A | Discharge status: Home / Self Care | Payer: Self-pay | Source: Ambulatory Visit | Attending: Emergency Medicine | Admitting: Emergency Medicine

## 2024-02-01 DIAGNOSIS — M545 Low back pain, unspecified: Secondary | ICD-10-CM

## 2024-02-01 DIAGNOSIS — R339 Retention of urine, unspecified: Secondary | ICD-10-CM

## 2024-02-01 DIAGNOSIS — G8929 Other chronic pain: Secondary | ICD-10-CM

## 2024-02-01 DIAGNOSIS — K420 Umbilical hernia with obstruction, without gangrene: Secondary | ICD-10-CM | POA: Diagnosis present

## 2024-02-01 DIAGNOSIS — R609 Edema, unspecified: Secondary | ICD-10-CM | POA: Insufficient documentation

## 2024-02-01 DIAGNOSIS — R300 Dysuria: Secondary | ICD-10-CM

## 2024-02-01 DIAGNOSIS — Z79899 Other long term (current) drug therapy: Secondary | ICD-10-CM | POA: Insufficient documentation

## 2024-02-01 DIAGNOSIS — R1024 Suprapubic pain: Secondary | ICD-10-CM | POA: Insufficient documentation

## 2024-02-01 DIAGNOSIS — E785 Hyperlipidemia, unspecified: Secondary | ICD-10-CM | POA: Insufficient documentation

## 2024-02-01 DIAGNOSIS — F17210 Nicotine dependence, cigarettes, uncomplicated: Secondary | ICD-10-CM | POA: Insufficient documentation

## 2024-02-01 DIAGNOSIS — I10 Essential (primary) hypertension: Secondary | ICD-10-CM | POA: Insufficient documentation

## 2024-02-01 DIAGNOSIS — K429 Umbilical hernia without obstruction or gangrene: Secondary | ICD-10-CM

## 2024-02-01 LAB — URINALYSIS, ROUTINE W REFLEX MICROSCOPIC
Bacteria, UA: NONE SEEN
Bilirubin Urine: NEGATIVE
Glucose, UA: NEGATIVE mg/dL
Ketones, ur: NEGATIVE mg/dL
Leukocytes,Ua: NEGATIVE
Nitrite: NEGATIVE
Protein, ur: 30 mg/dL — AB
Specific Gravity, Urine: 1.026 (ref 1.005–1.030)
pH: 5 (ref 5.0–8.0)

## 2024-02-01 LAB — CBC WITH DIFFERENTIAL/PLATELET
Abs Immature Granulocytes: 0.04 K/uL (ref 0.00–0.07)
Basophils Absolute: 0 K/uL (ref 0.0–0.1)
Basophils Relative: 0 %
Eosinophils Absolute: 0 K/uL (ref 0.0–0.5)
Eosinophils Relative: 0 %
HCT: 43.9 % (ref 39.0–52.0)
Hemoglobin: 14.3 g/dL (ref 13.0–17.0)
Immature Granulocytes: 0 %
Lymphocytes Relative: 5 %
Lymphs Abs: 0.7 K/uL (ref 0.7–4.0)
MCH: 28.8 pg (ref 26.0–34.0)
MCHC: 32.6 g/dL (ref 30.0–36.0)
MCV: 88.5 fL (ref 80.0–100.0)
Monocytes Absolute: 0.3 K/uL (ref 0.1–1.0)
Monocytes Relative: 2 %
Neutro Abs: 12.4 K/uL — ABNORMAL HIGH (ref 1.7–7.7)
Neutrophils Relative %: 93 %
Platelets: 222 K/uL (ref 150–400)
RBC: 4.96 MIL/uL (ref 4.22–5.81)
RDW: 13.5 % (ref 11.5–15.5)
WBC: 13.4 K/uL — ABNORMAL HIGH (ref 4.0–10.5)
nRBC: 0 % (ref 0.0–0.2)

## 2024-02-01 LAB — COMPREHENSIVE METABOLIC PANEL WITH GFR
ALT: 73 U/L — ABNORMAL HIGH (ref 0–44)
AST: 37 U/L (ref 15–41)
Albumin: 4.6 g/dL (ref 3.5–5.0)
Alkaline Phosphatase: 83 U/L (ref 38–126)
Anion gap: 16 — ABNORMAL HIGH (ref 5–15)
BUN: 14 mg/dL (ref 6–20)
CO2: 19 mmol/L — ABNORMAL LOW (ref 22–32)
Calcium: 9.3 mg/dL (ref 8.9–10.3)
Chloride: 100 mmol/L (ref 98–111)
Creatinine, Ser: 0.79 mg/dL (ref 0.61–1.24)
GFR, Estimated: >60 mL/min (ref >60)
Glucose, Bld: 151 mg/dL — ABNORMAL HIGH (ref 70–99)
Potassium: 4.3 mmol/L (ref 3.5–5.1)
Sodium: 135 mmol/L (ref 135–145)
Total Bilirubin: 0.7 mg/dL (ref 0.0–1.2)
Total Protein: 8 g/dL (ref 6.5–8.1)

## 2024-02-01 SURGERY — REPAIR, HERNIA, UMBILICAL, ROBOT-ASSISTED
Anesthesia: General | Site: Abdomen

## 2024-02-01 MED ORDER — DEXMEDETOMIDINE HCL IN NACL 80 MCG/20ML IV SOLN
INTRAVENOUS | Status: DC | PRN
  Administered 2024-02-01: 2 ug via INTRAVENOUS

## 2024-02-01 MED ORDER — ONDANSETRON HCL 4 MG PO TABS: 4.0000 mg | ORAL_TABLET | Freq: Every day | ORAL | 1 refills | Status: AC | PRN

## 2024-02-01 MED ORDER — HYDROMORPHONE HCL 1 MG/ML IJ SOLN
INTRAMUSCULAR | Status: DC | PRN
  Administered 2024-02-01 (×2): .5 mg via INTRAVENOUS

## 2024-02-01 MED ORDER — HYDROMORPHONE HCL 1 MG/ML IJ SOLN
INTRAMUSCULAR | Status: AC
  Filled 2024-02-01: qty 0.5

## 2024-02-01 MED ORDER — LACTATED RINGERS IV SOLN: INTRAVENOUS | Status: DC

## 2024-02-01 MED ORDER — PHENYLEPHRINE HCL-NACL 20-0.9 MG/250ML-% IV SOLN
INTRAVENOUS | Status: DC | PRN
  Administered 2024-02-01 (×2): 80 ug via INTRAVENOUS

## 2024-02-01 MED ORDER — OXYCODONE HCL 5 MG PO TABS: 5.0000 mg | ORAL_TABLET | Freq: Four times a day (QID) | ORAL | 0 refills | Status: DC | PRN

## 2024-02-01 MED ORDER — LIDOCAINE 2% (20 MG/ML) 5 ML SYRINGE
INTRAMUSCULAR | Status: AC
  Filled 2024-02-01: qty 5

## 2024-02-01 MED ORDER — TAMSULOSIN HCL 0.4 MG PO CAPS: 0.4000 mg | ORAL_CAPSULE | Freq: Every day | ORAL | 0 refills | Status: AC

## 2024-02-01 MED ORDER — BUPIVACAINE HCL (PF) 0.5 % IJ SOLN
INTRAMUSCULAR | Status: AC
  Filled 2024-02-01: qty 30

## 2024-02-01 MED ORDER — ONDANSETRON HCL 4 MG/2ML IJ SOLN: 4.0000 mg | Freq: Once | INTRAMUSCULAR | Status: DC | PRN

## 2024-02-01 MED ORDER — ONDANSETRON HCL 4 MG/2ML IJ SOLN
INTRAMUSCULAR | Status: AC
  Filled 2024-02-01: qty 2

## 2024-02-01 MED ORDER — MIDAZOLAM HCL 2 MG/2ML IJ SOLN
INTRAMUSCULAR | Status: AC
  Filled 2024-02-01: qty 2

## 2024-02-01 MED ORDER — ACETAMINOPHEN 10 MG/ML IV SOLN
INTRAVENOUS | Status: AC
  Filled 2024-02-01: qty 100

## 2024-02-01 MED ORDER — PROPOFOL 10 MG/ML IV BOLUS
INTRAVENOUS | Status: AC
  Filled 2024-02-01: qty 20

## 2024-02-01 MED ORDER — SUGAMMADEX SODIUM 200 MG/2ML IV SOLN
INTRAVENOUS | Status: AC
  Filled 2024-02-01: qty 8

## 2024-02-01 MED ORDER — CHLORHEXIDINE GLUCONATE 0.12 % MT SOLN
15.0000 mL | Freq: Once | OROMUCOSAL | Status: AC
  Administered 2024-02-01: 15 mL via OROMUCOSAL

## 2024-02-01 MED ORDER — DEXMEDETOMIDINE HCL IN NACL 80 MCG/20ML IV SOLN
INTRAVENOUS | Status: AC
  Filled 2024-02-01: qty 20

## 2024-02-01 MED ORDER — CEFAZOLIN SODIUM-DEXTROSE 3-4 GM/150ML-% IV SOLN
3.0000 g | INTRAVENOUS | Status: AC
  Administered 2024-02-01: 3 g via INTRAVENOUS

## 2024-02-01 MED ORDER — DEXAMETHASONE SOD PHOSPHATE PF 10 MG/ML IJ SOLN
INTRAMUSCULAR | Status: DC | PRN
  Administered 2024-02-01: 10 mg via INTRAVENOUS

## 2024-02-01 MED ORDER — ACETAMINOPHEN 500 MG PO TABS: 1000.0000 mg | ORAL_TABLET | Freq: Four times a day (QID) | ORAL | 0 refills | Status: AC

## 2024-02-01 MED ORDER — CEFAZOLIN SODIUM-DEXTROSE 3-4 GM/150ML-% IV SOLN
INTRAVENOUS | Status: AC
  Filled 2024-02-01: qty 150

## 2024-02-01 MED ORDER — KETOROLAC TROMETHAMINE 30 MG/ML IJ SOLN
INTRAMUSCULAR | Status: AC
  Filled 2024-02-01: qty 2

## 2024-02-01 MED ORDER — STERILE WATER FOR IRRIGATION IR SOLN
Status: DC | PRN
  Administered 2024-02-01: 1000 mL

## 2024-02-01 MED ORDER — DIPHENHYDRAMINE HCL 50 MG/ML IJ SOLN
INTRAMUSCULAR | Status: AC
  Filled 2024-02-01: qty 1

## 2024-02-01 MED ORDER — ONDANSETRON HCL 4 MG/2ML IJ SOLN
INTRAMUSCULAR | Status: DC | PRN
  Administered 2024-02-01: 4 mg via INTRAVENOUS

## 2024-02-01 MED ORDER — PHENYLEPHRINE 80 MCG/ML (10ML) SYRINGE FOR IV PUSH (FOR BLOOD PRESSURE SUPPORT)
PREFILLED_SYRINGE | INTRAVENOUS | Status: AC
  Filled 2024-02-01: qty 30

## 2024-02-01 MED ORDER — BUPIVACAINE HCL (PF) 0.5 % IJ SOLN
INTRAMUSCULAR | Status: DC | PRN
  Administered 2024-02-01: 30 mL

## 2024-02-01 MED ORDER — FENTANYL CITRATE (PF) 100 MCG/2ML IJ SOLN
INTRAMUSCULAR | Status: DC | PRN
  Administered 2024-02-01 (×2): 50 ug via INTRAVENOUS
  Administered 2024-02-01: 100 ug via INTRAVENOUS
  Administered 2024-02-01: 50 ug via INTRAVENOUS

## 2024-02-01 MED ORDER — LIDOCAINE 2% (20 MG/ML) 5 ML SYRINGE
INTRAMUSCULAR | Status: AC
  Filled 2024-02-01: qty 10

## 2024-02-01 MED ORDER — ROCURONIUM BROMIDE 10 MG/ML (PF) SYRINGE
PREFILLED_SYRINGE | INTRAVENOUS | Status: AC
  Filled 2024-02-01: qty 30

## 2024-02-01 MED ORDER — OXYCODONE HCL 5 MG/5ML PO SOLN: 5.0000 mg | Freq: Once | ORAL | Status: AC | PRN

## 2024-02-01 MED ORDER — PROPOFOL 10 MG/ML IV BOLUS
INTRAVENOUS | Status: DC | PRN
  Administered 2024-02-01: 200 mg via INTRAVENOUS

## 2024-02-01 MED ORDER — OXYCODONE HCL 5 MG PO TABS
5.0000 mg | ORAL_TABLET | Freq: Once | ORAL | Status: AC | PRN
  Administered 2024-02-01: 5 mg via ORAL
  Filled 2024-02-01: qty 1

## 2024-02-01 MED ORDER — KETOROLAC TROMETHAMINE 30 MG/ML IJ SOLN
INTRAMUSCULAR | Status: DC | PRN
  Administered 2024-02-01: 30 mg via INTRAVENOUS

## 2024-02-01 MED ORDER — CHLORHEXIDINE GLUCONATE CLOTH 2 % EX PADS: 6.0000 | MEDICATED_PAD | Freq: Once | CUTANEOUS | Status: DC

## 2024-02-01 MED ORDER — DOCUSATE SODIUM 100 MG PO CAPS: 100.0000 mg | ORAL_CAPSULE | Freq: Two times a day (BID) | ORAL | 2 refills | Status: AC

## 2024-02-01 MED ORDER — LIDOCAINE HCL (CARDIAC) PF 100 MG/5ML IV SOSY
PREFILLED_SYRINGE | INTRAVENOUS | Status: DC | PRN
  Administered 2024-02-01: 100 mg via INTRATRACHEAL

## 2024-02-01 MED ORDER — SUCCINYLCHOLINE CHLORIDE 200 MG/10ML IV SOSY
PREFILLED_SYRINGE | INTRAVENOUS | Status: DC | PRN
  Administered 2024-02-01: 200 mg via INTRAVENOUS

## 2024-02-01 MED ORDER — ORAL CARE MOUTH RINSE: 15.0000 mL | Freq: Once | OROMUCOSAL | Status: AC

## 2024-02-01 MED ORDER — ROCURONIUM BROMIDE 10 MG/ML (PF) SYRINGE
PREFILLED_SYRINGE | INTRAVENOUS | Status: DC | PRN
  Administered 2024-02-01: 80 mg via INTRAVENOUS
  Administered 2024-02-01 (×2): 20 mg via INTRAVENOUS

## 2024-02-01 MED ORDER — ACETAMINOPHEN 10 MG/ML IV SOLN
INTRAVENOUS | Status: DC | PRN
  Administered 2024-02-01: 1000 mg via INTRAVENOUS

## 2024-02-01 MED ORDER — SUGAMMADEX SODIUM 200 MG/2ML IV SOLN
INTRAVENOUS | Status: DC | PRN
  Administered 2024-02-01: 400 mg via INTRAVENOUS

## 2024-02-01 MED ORDER — FENTANYL CITRATE (PF) 50 MCG/ML IJ SOSY
25.0000 ug | PREFILLED_SYRINGE | INTRAMUSCULAR | Status: DC | PRN
  Administered 2024-02-01: 50 ug via INTRAVENOUS
  Filled 2024-02-01: qty 1

## 2024-02-01 MED ORDER — PHENYLEPHRINE 80 MCG/ML (10ML) SYRINGE FOR IV PUSH (FOR BLOOD PRESSURE SUPPORT)
PREFILLED_SYRINGE | INTRAVENOUS | Status: AC
  Filled 2024-02-01: qty 10

## 2024-02-01 MED ORDER — FENTANYL CITRATE (PF) 250 MCG/5ML IJ SOLN
INTRAMUSCULAR | Status: AC
  Filled 2024-02-01: qty 5

## 2024-02-01 MED ORDER — DIPHENHYDRAMINE HCL 50 MG/ML IJ SOLN
INTRAMUSCULAR | Status: DC | PRN
  Administered 2024-02-01: 25 mg via INTRAVENOUS

## 2024-02-01 MED ORDER — ROCURONIUM BROMIDE 10 MG/ML (PF) SYRINGE
PREFILLED_SYRINGE | INTRAVENOUS | Status: AC
  Filled 2024-02-01: qty 10

## 2024-02-01 SURGICAL SUPPLY — 38 items
BLADE SURG 15 STRL LF DISP TIS (BLADE) ×1 IMPLANT
CHLORAPREP W/TINT 26 (MISCELLANEOUS) ×1 IMPLANT
COVER LIGHT HANDLE (MISCELLANEOUS) IMPLANT
COVER MAYO STAND XLG (MISCELLANEOUS) ×1 IMPLANT
COVER TIP SHEARS 8 DVNC (MISCELLANEOUS) ×1 IMPLANT
DEFOGGER SCOPE WARM SEASHARP (MISCELLANEOUS) ×1 IMPLANT
DERMABOND ADVANCED .7 DNX12 (GAUZE/BANDAGES/DRESSINGS) ×1 IMPLANT
DRAPE ARM DVNC X/XI (DISPOSABLE) ×3 IMPLANT
DRAPE COLUMN DVNC XI (DISPOSABLE) ×1 IMPLANT
DRIVER NDL MEGA SUTCUT DVNCXI (INSTRUMENTS) ×1 IMPLANT
ELECTRODE REM PT RTRN 9FT ADLT (ELECTROSURGICAL) ×1 IMPLANT
FORCEPS BPLR 8 MD DVNC XI (FORCEP) ×1 IMPLANT
GLOVE BIOGEL PI IND STRL 6.5 (GLOVE) ×2 IMPLANT
GLOVE BIOGEL PI IND STRL 7.0 (GLOVE) ×2 IMPLANT
GLOVE SURG SS PI 6.5 STRL IVOR (GLOVE) ×2 IMPLANT
GOWN STRL REUS W/TWL LRG LVL3 (GOWN DISPOSABLE) ×3 IMPLANT
GRASPER SUT TROCAR 14GX15 (MISCELLANEOUS) ×1 IMPLANT
IRRIGATOR SUCT 8 DISP DVNC XI (IRRIGATION / IRRIGATOR) IMPLANT
KIT TURNOVER KIT A (KITS) ×1 IMPLANT
MANIFOLD NEPTUNE II (INSTRUMENTS) ×1 IMPLANT
MESH VENTRALIGHT ST 4.5IN (Mesh General) IMPLANT
NDL HYPO 21X1.5 SAFETY (NEEDLE) ×1 IMPLANT
NDL INSUFFLATION 14GA 120MM (NEEDLE) ×1 IMPLANT
OBTURATOR OPTICALSTD 8 DVNC (TROCAR) ×1 IMPLANT
PACK LAP CHOLE LZT030E (CUSTOM PROCEDURE TRAY) ×1 IMPLANT
PENCIL HANDSWITCHING (ELECTRODE) ×1 IMPLANT
POSITIONER HEAD 8X9X4 ADT (SOFTGOODS) ×1 IMPLANT
SCISSORS MNPLR CVD DVNC XI (INSTRUMENTS) ×1 IMPLANT
SEAL UNIV 5-12 XI (MISCELLANEOUS) ×3 IMPLANT
SET BASIN LINEN APH (SET/KITS/TRAYS/PACK) ×1 IMPLANT
SET TUBE SMOKE EVAC HIGH FLOW (TUBING) ×1 IMPLANT
SUT MNCRL AB 4-0 PS2 18 (SUTURE) ×1 IMPLANT
SUT STRATA 3-0 SH (SUTURE) ×2 IMPLANT
SUTURE STRATFX 0 PDS+ CT-2 23 (SUTURE) ×1 IMPLANT
SYR 30ML LL (SYRINGE) ×1 IMPLANT
TAPE TRANSPORE STRL 2 31045 (GAUZE/BANDAGES/DRESSINGS) ×1 IMPLANT
TRAY FOLEY W/BAG SLVR 16FR ST (SET/KITS/TRAYS/PACK) ×1 IMPLANT
WATER STERILE IRR 500ML POUR (IV SOLUTION) ×1 IMPLANT

## 2024-02-01 NOTE — Discharge Instructions (Addendum)
 To the ER today due to difficulty urinating.  Fortunately you are now able to urinate.  Take the Flomax for 2 weeks follow-up with allergy.  Come back to the ER if you have new or worsening symptoms.  Follow-up with urology if needed.

## 2024-02-01 NOTE — Telephone Encounter (Signed)
 [facility]  Called by patient's father regarding urinary retention.  Patient underwent a robotic assisted laparoscopic umbilical hernia repair with mesh earlier today.  Patient's father states that he has been unable to urinate any significant volume since leaving the hospital.  He has significant burning and sharp pains at his urethral meatus.  He has attempted to stand to assist with urination for 15 minutes without significant success.  He was yelling out in pain during the phone encounter.  Patient's father also states that he had an axillary temperature of 100.5 but a forehead temperature of 98.5.  I explained to the patient's father that he had a Foley catheter during his umbilical hernia repair earlier today.  I suspect that he has some irritation of the urethra likely from Foley catheter insertion, which is resulting in the significant pain and urinary retention.  I recommended to the patient's father that he should present to the emergency department for further evaluation and management.  All questions were answered to the patient's father's expressed satisfaction.  While in the emergency department, decision can be discussed with the patient for either in and out catheter and then void trial 6 hours later after receiving IV fluids versus placing a Foley catheter with plans for discharge home with a leg bag and outpatient urology follow-up in 1 week.  If the patient prefers for discharge home from the emergency department, I would recommend that a 2-week prescription for Flomax 0.4 mg daily be ordered.  If patient has concerns about discharge home, okay to place patient in observation to my service.  I called and updated the ED provider regarding this plan.    Dorothyann Brittle, DO [facility] [address] [place], [place] [address] [phone] (office)

## 2024-02-01 NOTE — ED Provider Notes (Signed)
 [place] [facility] AT [facility] Provider Note   CSN: [id number] Arrival date & time: 02/01/24  8173     Patient presents with: Urinary Retention   Bruce Nguyen is Nguyen 38 y.o. male.  Presents to the ER today for inability urinate.  He had umbilical repair today with Dr. Evonnie and was not able to urinate after going home, having pain at the urethral meatus.  Denies nausea or vomiting, states surgery went well, asking for something to eat.   HPI     Prior to Admission medications   Medication Sig Start Date End Date Taking? Authorizing Provider  acetaminophen  (TYLENOL ) 500 MG tablet Take 2 tablets (1,000 mg total) by mouth every 6 (six) hours for 7 days. 02/01/24 02/08/24  Pappayliou, Dorothyann A, DO  albuterol  (PROVENTIL  HFA;VENTOLIN  HFA) 108 (90 Base) MCG/ACT inhaler Inhale 1-2 puffs into the lungs every 6 (six) hours as needed for wheezing or shortness of breath.    [provider]  albuterol  (PROVENTIL ) (2.5 MG/3ML) 0.083% nebulizer solution Take 3 mLs (2.5 mg total) by nebulization every 4 (four) hours as needed for wheezing or shortness of breath. 04/18/17   Bruce Pac, MD  docusate sodium  (COLACE) 100 MG capsule Take 1 capsule (100 mg total) by mouth 2 (two) times daily. 02/01/24 01/31/25  Pappayliou, Dorothyann A, DO  ondansetron  (ZOFRAN ) 4 MG tablet Take 1 tablet (4 mg total) by mouth daily as needed for nausea or vomiting. 02/01/24 01/31/25  Pappayliou, Dorothyann A, DO  oxyCODONE  (ROXICODONE ) 5 MG immediate release tablet Take 1 tablet (5 mg total) by mouth every 6 (six) hours as needed. 02/01/24   Pappayliou, Dorothyann A, DO    Allergies: Chlorhexidine , Iodinated contrast media, and Iodine    Review of Systems  Updated Vital Signs BP (!) 136/90   Pulse 89   Temp 98.3 F (36.8 C) (Oral)   Resp 20   Ht 6' 1 (1.854 m)   Wt (!) 174.2 kg   SpO2 94%   BMI 50.67 kg/m   Physical Exam Vitals and nursing note reviewed.   Constitutional:      General: He is not in acute distress.    Appearance: He is well-developed.  HENT:     Head: Normocephalic and atraumatic.     Mouth/Throat:     Mouth: Mucous membranes are moist.  Eyes:     Extraocular Movements: Extraocular movements intact.     Conjunctiva/sclera: Conjunctivae normal.     Pupils: Pupils are equal, round, and reactive to light.  Cardiovascular:     Rate and Rhythm: Normal rate and regular rhythm.     Heart sounds: No murmur heard. Pulmonary:     Effort: Pulmonary effort is normal. No respiratory distress.     Breath sounds: Normal breath sounds.  Abdominal:     Palpations: Abdomen is soft.     Tenderness: There is no abdominal tenderness.     Comments: Mild suprapubic tenderness and bladder distention noted  Musculoskeletal:        General: No swelling.     Cervical back: Neck supple.  Skin:    General: Skin is warm and dry.     Capillary Refill: Capillary refill takes less than 2 seconds.  Neurological:     General: No focal deficit present.     Mental Status: He is alert and oriented to person, place, and time.  Psychiatric:        Mood and Affect: Mood normal.     (  all labs ordered are listed, but only abnormal results are displayed) Labs Reviewed  URINALYSIS, ROUTINE W REFLEX MICROSCOPIC - Abnormal; Notable for the following components:      Result Value   Hgb urine dipstick SMALL (*)    Protein, ur 30 (*)    All other components within normal limits  CBC WITH DIFFERENTIAL/PLATELET - Abnormal; Notable for the following components:   WBC 13.4 (*)    Neutro Abs 12.4 (*)    All other components within normal limits  COMPREHENSIVE METABOLIC PANEL WITH GFR - Abnormal; Notable for the following components:   CO2 19 (*)    Glucose, Bld 151 (*)    ALT 73 (*)    Anion gap 16 (*)    All other components within normal limits    EKG: None  Radiology: No results found.   Procedures   Medications Ordered in the ED - No data  to display                                  Medical Decision Making Ddx: Urinary retention, UTI, other ED course: Patient presents for evaluation of urinary retention.  He had Nguyen Foley catheter placed for umbilical hernia surgery today, and upon going home he was not able to empty his bladder, he is complaining of Nguyen lot of pain and burning at the urethral meatus.  Urethral meatus on exam did not show any irritation or bleeding.  He had called his surgeon, Dr. Evonnie, prior to being sent to the ER.  She did call and notify staff that he was coming in, and kindly wrote Nguyen note with the plan.  Ultimately patient opted for the option of bladder drainage with straight catheterization in the ED and void trial in the ED.  He was also offered Foley catheter and discharged with outpatient urology follow-up.  Patient had relief of his discomfort after the straight cath to drain his bladder.  He did refuse IV fluids, stating he does not want to be stuck with Nguyen needle, so he drank multiple cups of water  and some ginger ale.  On recheck he had voided between 3 and 400 cc of urine, he states it did burn but he was feeling much better.  He was sent home with Flomax  and advised on follow-up and strict return precautions.  Amount and/or Complexity of Data Reviewed Labs: ordered.  Risk Prescription drug management.        Final diagnoses:  None    ED Discharge Orders     None          Bruce Nelon A, PA-C 02/02/24 9050    Bruce Hamilton, MD 02/04/24 [phone]

## 2024-02-01 NOTE — Discharge Instructions (Addendum)
 Ambulatory Surgery Discharge Instructions  General Anesthesia or Sedation Do not drive or operate heavy machinery for 24 hours.  Do not consume alcohol, tranquilizers, sleeping medications, or any non-prescribed medications for 24 hours. Do not make important decisions or sign any important papers in the next 24 hours. You should have someone with you tonight at home.  Activity  You are advised to go directly home from the hospital.  Restrict your activities and rest for a day.  Resume light activity tomorrow. No pushing, pulling, bending over, or lifting objects over 10 lbs or strenuous exercise.  Fluids and Diet Begin with clear liquids, bouillon, dry toast, soda crackers.  If not nauseated, you may go to a regular diet when you desire.  Greasy and spicy foods are not advised.  Medications  If you have not had a bowel movement in 24 hours, take 2 tablespoons over the counter Milk of mag.             You May resume your blood thinners tomorrow (Aspirin, coumadin, or other).  You are being discharged with prescriptions for Opioid/Narcotic Medications: There are some specific considerations for these medications that you should know. Opioid Meds have risks & benefits. Addiction to these meds is always a concern with prolonged use Take medication only as directed Do not drive while taking narcotic pain medication Do not crush tablets or capsules Do not use a different container than medication was dispensed in Lock the container of medication in a cool, dry place out of reach of children and pets. Opioid medication can cause addiction Do not share with anyone else (this is a felony) Do not store medications for future use. Dispose of them properly.     Disposal:  Find a [place]  household drug take back site near you.  If you can't get to a drug take back site, use the recipe below as a last resort to dispose of expired, unused or unwanted drugs. Disposal  (Do not dispose  chemotherapy drugs this way, talk to your prescribing doctor instead.) Step 1: Mix drugs (do not crush) with dirt, kitty litter, or used coffee grounds and add a small amount of water to dissolve any solid medications. Step 2: Seal drugs in plastic bag. Step 3: Place plastic bag in trash. Step 4: Take prescription container and scratch out personal information, then recycle or throw away.  Operative Site  You have a liquid bandage over your incisions, this will begin to flake off in about a week. Ok to english as a second language teacher. You also have a dressing at your belly button.  This can be removed in 2-3 days.  Keep wound clean and dry. No baths or swimming. No lifting more than 10 pounds.  You may notice a bump/bulge at the hernia site within a week after surgery.  This is likely fluid that has built up after the hernia surgery.  There is nothing to do for this, as your body will reabsorb the fluid.  Contact Information: If you have questions or concerns, please call our office, [phone], Monday- Thursday 8AM-5PM and Friday 8AM-12Noon.  If it is after hours or on the weekend, please call [facility] Main Number, [phone], and ask to speak to the surgeon on call for Dr. Evonnie at [facility].   SPECIFIC COMPLICATIONS TO WATCH FOR: Inability to urinate Fever over 101? F by mouth Nausea and vomiting lasting longer than 24 hours. Pain not relieved by medication ordered Swelling around the operative site Increased redness, warmth, hardness,  around operative area Numbness, tingling, or cold fingers or toes Blood -soaked dressing, (small amounts of oozing may be normal) Increasing and progressive drainage from surgical area or exam site

## 2024-02-01 NOTE — Anesthesia Preprocedure Evaluation (Signed)
 Anesthesia Evaluation  Patient identified by MRN, date of birth, ID band Patient awake    Reviewed: Allergy & Precautions, H&P , NPO status , Patient's Chart, lab work & pertinent test results, reviewed documented beta blocker date and time   Airway Mallampati: II  TM Distance: >3 FB Neck ROM: full    Dental no notable dental hx.    Pulmonary asthma , Current Smoker   Pulmonary exam normal breath sounds clear to auscultation       Cardiovascular Exercise Tolerance: Good hypertension,  Rhythm:regular Rate:Normal     Neuro/Psych  PSYCHIATRIC DISORDERS      negative neurological ROS     GI/Hepatic negative GI ROS, Neg liver ROS,,,  Endo/Other  negative endocrine ROS    Renal/GU negative Renal ROS  negative genitourinary   Musculoskeletal   Abdominal   Peds  Hematology negative hematology ROS (+)   Anesthesia Other Findings   Reproductive/Obstetrics negative OB ROS                              Anesthesia Physical Anesthesia Plan  ASA: 2  Anesthesia Plan: General and General ETT   Post-op Pain Management:    Induction:   PONV Risk Score and Plan: Ondansetron   Airway Management Planned:   Additional Equipment:   Intra-op Plan:   Post-operative Plan:   Informed Consent: I have reviewed the patients History and Physical, chart, labs and discussed the procedure including the risks, benefits and alternatives for the proposed anesthesia with the patient or authorized representative who has indicated his/her understanding and acceptance.     Dental Advisory Given  Plan Discussed with: CRNA  Anesthesia Plan Comments:         Anesthesia Quick Evaluation

## 2024-02-01 NOTE — Progress Notes (Signed)
 [facility]  Spoke with the patient's wife in the consultation room.  I explained that he tolerated the procedure without difficulty.  He has dissolvable stitches under the skin with overlying skin glue.  This will flake off in 10 to 14 days.  I discharged him home with a prescription for narcotic pain medication that they should take as needed for pain.  I also want him taking scheduled Tylenol .  If they take the narcotic pain medication, they should take a stool softener as well.  The patient will follow-up with me in 3 weeks.  All questions were answered to her expressed satisfaction.  Dorothyann Brittle, DO [facility] [address] [place], [place] [address] [phone] (office)

## 2024-02-01 NOTE — Anesthesia Procedure Notes (Signed)
 Procedure Name: Intubation Date/Time: 02/01/2024 7:42 AM  Performed by: Pheobe Adine CROME, CRNAPre-anesthesia Checklist: Patient identified, Emergency Drugs available, Suction available, Patient being monitored and Timeout performed Patient Re-evaluated:Patient Re-evaluated prior to induction Oxygen Delivery Method: Circle system utilized Preoxygenation: Pre-oxygenation with 100% oxygen Induction Type: IV induction Laryngoscope Size: Mac and 4 Grade View: Grade I Tube type: Oral Tube size: 7.5 mm Number of attempts: 1 Airway Equipment and Method: Stylet and Video-laryngoscopy Placement Confirmation: ETT inserted through vocal cords under direct vision, positive ETCO2, CO2 detector and breath sounds checked- equal and bilateral Secured at: 23 cm Tube secured with: Tape Dental Injury: Teeth and Oropharynx as per pre-operative assessment

## 2024-02-01 NOTE — Op Note (Addendum)
 [facility] Operative Note  02/01/24  Preoperative Diagnosis: Incarcerated umbilical hernia   Postoperative Diagnosis: Incarcerated umbilical hernia, 3 cm   Procedure(s) Performed: Robotic assisted laparoscopic umbilical hernia repair with mesh, defect size 3 cm   Surgeon: Dorothyann Brittle, DO   Assistants: No qualified resident was available    Anesthesia: General endotracheal   Anesthesiologist: Kendell Yvonna PARAS, MD    Specimens: None   Estimated Blood Loss: Minimal   Blood Replacement: None    Complications: None   Wound Class: Clean   Operative Indications: Patient is a 38 year old male who presents for umbilical hernia repair.  The hernia has been present for at least a year, it has been increasing in size, and it intermittently causes him pain.  He would like it repaired at this time.  All risks and benefits of performing this procedure were discussed with the patient including pain, infection, bleeding, damage to the surrounding structures, need for more procedures or surgery, and high risk of hernia recurrence, especially given the patient's body habitus. The patient voiced understanding of the procedure, all questions were sought and answered, and consent was obtained.  Findings: 3 cm umbilical hernia defect Tension free repair achieved with Ventralight 4.5 inch mesh and suture Adequate hemostasis    Procedure: The patient was brought to the operating room and general anesthesia was induced. A time-out was completed verifying correct patient, procedure, site, positioning, and implant(s) and/or special equipment prior to beginning this procedure. Antibiotics were administered prior to making the incision. SCDs placed. The anterior abdominal wall was prepped and draped in the standard sterile fashion.   A stab incision was made at Palmer's point.  A towel clip was used to elevate the abdominal wall. A Veress needle placed and the saline drop test  was performed. The abdomen was insufflated to 15cm without issues. 8 mm port was then placed at the Palmer's point incision using the optiview technique.  No injuries were noted from insufflation and port insertion.  2 additional lateral ports on the left side were placed using the optiview ports, placing the 8mm central camera port and an additional 8mm port on in the left lower quadrant with adequate space between.  The [place] robot then docked into place.  The hernia was noted to be umbilical and containing omentum. The hernia contents were noted and reduced with combination of blunt, sharp dissection with scissors and fenestrated forceps.  Hemostasis achieved throughout this portion.  Once all hernia contents reduced, there was noted to be a 3  cm hernia defect.  A fat and peritoneal flap were created to clear the space for adequate fixation of the mesh.   The Ventralight mesh and sutures were placed in the abdominal cavity under direct visualization.  A transfacial suture with 0 stratafix used to primarily close defect under minimal tension. The stratafix was then passed through the center of the mesh (coated side out) to secured the mesh to the abdominal wall centered over the defect.  The mesh was then circumferentially sutured into the anterior abdominal wall using 3-0 stratafix x2.  Any bleeding noted during this portion was no longer actively bleeding by end of securing mesh and tightening the suture.  The peritoneal flap overlying the mesh was then closed with 3-0 stratafix and the holes in the flap were closed with 3-0 stratafix.  The robot was undocked.  The abdomen was then de-sufflated and the ports were removed.  Marcaine  was instilled at the incision sites.  All skin incisions closed with subcuticular 4-0 Monocryl and dermabond. Final inspection revealed acceptable hemostasis.   All counts were correct at the end of the case. The patient was awakened from anesthesia and extubated without  complication.  The patient went to the PACU in stable condition.   Dorothyann Brittle, DO [facility] [address] [place], [place] [address] [phone] (office)

## 2024-02-01 NOTE — H&P (Signed)
 [facility] History and Physical   Reason for Referral: Umbilical hernia Referring Physician: Meade Gerlach, FNP   Chief Complaint   New Patient (Initial Visit)        Bruce Nguyen is a 38 y.o. male.  HPI: Patient presents for evaluation of an umbilical hernia.  It has been present for at least a year, but he is not sure exactly how long.  He thinks it appears after some heavy lifting.  More recently he has started to have pain associated with it and has been increasing in size.  He has never tried to reduce it but when he lays down it seems to partially reduce.  He is tolerating a diet without nausea and vomiting and having regular bowel movements.  His past medical history significant for hypertension, hyperlipidemia, and fluid retention on diuretics.  He denies history of abdominal surgeries.  He occasionally will smoke cigarettes or consume alcohol.  He denies use of illicit drugs.       Past Medical History:  Diagnosis Date   ADHD     Asthma     Enlarged heart     OCD (obsessive compulsive disorder)                 Past Surgical History:  Procedure Laterality Date   COLONOSCOPY                   Family History  Problem Relation Age of Onset   Diabetes Father     Diabetes Other     Thyroid disease Sister            Social History  Social History         Tobacco Use   Smoking status: Light Smoker   Smokeless tobacco: Never   Tobacco comments:      Every now and then  Vaping Use   Vaping status: Former  Substance Use Topics   Alcohol use: Yes      Comment: social   Drug use: No        Medications: I have reviewed the patient's current medications. Allergies as of 12/06/2023         Reactions    Iodinated Contrast Media      Iodine Itching            Medication List           Accurate as of December 06, 2023 11:59 PM. If you have any questions, ask your nurse or doctor.              STOP taking these medications      atorvastatin 20 MG tablet Commonly known as: LIPITOR Stopped by: Juanjesus Pepperman A Breyona Swander    cyclobenzaprine  5 MG tablet Commonly known as: FLEXERIL  Stopped by: Dallys Nowakowski A Monte Zinni    lisinopril 20 MG tablet Commonly known as: ZESTRIL Stopped by: Sayge Brienza A Matthews Franks    methocarbamol  500 MG tablet Commonly known as: ROBAXIN  Stopped by: Alsace Dowd A Litzi Binning    Vitamin D  (Ergocalciferol ) 1.25 MG (50000 UNIT) Caps capsule Commonly known as: DRISDOL  Stopped by: Anjana Cheek A Lesslie Mckeehan           TAKE these medications     acetaminophen  500 MG tablet Commonly known as: TYLENOL  Take 2 tablets (1,000 mg total) by mouth every 6 (six) hours as needed for mild pain (pain score 1-3) or headache.    albuterol  108 (90 Base) MCG/ACT inhaler Commonly known as: VENTOLIN  HFA Inhale 1-2 puffs into  the lungs every 6 (six) hours as needed for wheezing or shortness of breath.    albuterol  (2.5 MG/3ML) 0.083% nebulizer solution Commonly known as: PROVENTIL  Take 3 mLs (2.5 mg total) by nebulization every 4 (four) hours as needed for wheezing or shortness of breath.               ROS:  Constitutional: negative for chills, fatigue, and fevers Eyes: negative for visual disturbance and pain Ears, nose, mouth, throat, and face: negative for ear drainage, sore throat, and sinus problems Respiratory: negative for cough, wheezing, and shortness of breath Cardiovascular: negative for chest pain and palpitations Gastrointestinal: positive for abdominal pain and reflux symptoms, negative for nausea and vomiting Genitourinary:negative for dysuria and frequency Integument/breast: negative for dryness and rash Hematologic/lymphatic: negative for bleeding and lymphadenopathy Musculoskeletal:negative for back pain and neck pain Neurological: negative for dizziness and tremors Endocrine: negative for temperature intolerance   Blood pressure (!) 158/107, pulse 77, temperature 98 F (36.7 C),  temperature source Oral, resp. rate 20, height 6' 1 (1.854 m), weight (!) 384 lb (174.2 kg), SpO2 93%. Physical Exam Vitals reviewed.  Constitutional:      Appearance: Normal appearance.  HENT:     Head: Normocephalic and atraumatic.  Eyes:     Extraocular Movements: Extraocular movements intact.     Pupils: Pupils are equal, round, and reactive to light.  Cardiovascular:     Rate and Rhythm: Normal rate and regular rhythm.  Pulmonary:     Effort: Pulmonary effort is normal.     Breath sounds: Normal breath sounds.  Abdominal:     Comments: Abdomen soft, nondistended, no percussion tenderness, mild TTP at umbilical hernia site; no rigidity, guarding, rebound tenderness; umbilical hernia 3 cm in size and unable to be completely reduced  Musculoskeletal:        General: Normal range of motion.     Cervical back: Normal range of motion.  Skin:    General: Skin is warm and dry.  Neurological:     General: No focal deficit present.     Mental Status: He is alert and oriented to person, place, and time.  Psychiatric:        Mood and Affect: Mood normal.        Behavior: Behavior normal.       Results: Lab Results Last 48 Hours  No results found for this or any previous visit (from the past 48 hours).     Imaging Results (Last 48 hours)  No results found.       Assessment & Plan:  Bruce Nguyen is a 38 y.o. male who presents for evaluation of an umbilical hernia.   -I discussed the pathophysiology of umbilical hernias and we discussed the need for recommendations repair.  I discussed with the patient that given his current weight, he is of much higher risk for hernia recurrence.  Patient states that he would like to try to lose a little bit of weight on his own prior to surgery.  He does not want to try any of the new weight loss medications, as he does not feel these will work for him.  He would like to try to schedule his surgery for sometime in December -The risk and  benefits of robotic assisted laparoscopic umbilical hernia repair with mesh were discussed including but not limited to bleeding, infection, injury to surrounding structures, need for additional procedures, and hernia recurrence.  After careful consideration, Bruce Nguyen has decided to proceed with  surgery.  -Patient tentatively scheduled for surgery on 12/10 -Information provided to the patient regarding umbilical hernias -Advised that the patient should present to the ED if they begin to have painful nonreducible bulge at his umbilicus, nausea, vomiting, and obstipation   All questions were answered to the satisfaction of the patient and family.   Note: Portions of this report may have been transcribed using voice recognition software. Every effort has been made to ensure accuracy; however, inadvertent computerized transcription errors may still be present.    Dorothyann Brittle, DO [facility] [address] [place], [place] [address] [phone] (office)

## 2024-02-01 NOTE — Transfer of Care (Signed)
 Immediate Anesthesia Transfer of Care Note  Patient: Bruce Nguyen  Procedure(s) Performed: REPAIR, HERNIA, UMBILICAL, ROBOT-ASSISTED (Abdomen)  Patient Location: PACU  Anesthesia Type:General  Level of Consciousness: drowsy, patient cooperative, and responds to stimulation  Airway & Oxygen Therapy: Patient Spontanous Breathing and Patient connected to face mask oxygen  Post-op Assessment: Report given to RN and Post -op Vital signs reviewed and stable  Post vital signs: Reviewed and stable  Last Vitals:  Vitals Value Taken Time  BP 144/78   Temp 98.9   Pulse 89 02/01/24 10:05  Resp 16 02/01/24 10:05  SpO2 100 % 02/01/24 10:05  Vitals shown include unfiled device data.  Last Pain:  Vitals:   02/01/24 0653  TempSrc: Oral  PainSc: 3       Patients Stated Pain Goal: 6 (02/01/24 9346)  Complications: No notable events documented.

## 2024-02-01 NOTE — ED Triage Notes (Signed)
 Pt arrived via POV after calling his physician following a hernia repair surgery earlier today and reports he has been unable to urinate since discharge home. Per Pts chart review, his physician spoke with the EDP about plan to fluid resuscitate the Pt, hold for 6 hours, start the Pt on Flomax and following up with Urology in 1 week. Pt reports he also needs to be monitored in case he falls a sleep due to severe sleep apnea and reports he stops breathing when he goes to sleep. Pt also reports being tired from taking prescribed pain medication PTA.

## 2024-02-04 ENCOUNTER — Telehealth: Payer: Self-pay | Admitting: Surgery

## 2024-02-04 DIAGNOSIS — G8918 Other acute postprocedural pain: Secondary | ICD-10-CM

## 2024-02-04 MED ORDER — OXYCODONE HCL 5 MG PO TABS: 5.0000 mg | ORAL_TABLET | Freq: Four times a day (QID) | ORAL | 0 refills | Status: DC | PRN

## 2024-02-04 MED ORDER — METHOCARBAMOL 500 MG PO TABS: 500.0000 mg | ORAL_TABLET | Freq: Three times a day (TID) | ORAL | 0 refills | Status: AC

## 2024-02-04 NOTE — Telephone Encounter (Signed)
 [facility]  Called the patient as he wanted to speak with me over some of his concerns.  He is stating that he has a lot of abdominal pain at his incision sites and at the hernia site when he tries to move around, and he wants to know if this abdominal pain is normal postoperatively.  He is currently taking the scheduled Tylenol  and ibuprofen .  He used his last pill of oxycodone  earlier this morning.  He also believes that some of his abdominal pain got a little bit worse after he was up and moving around on postop day 2, so he has been trying to decrease his mobility.  I discussed that pain at the hernia site and at the incision site is completely normal especially 3 to 4 days out from surgery.  We discussed that everyone handles pain a little bit differently.  I will send in a prescription for Robaxin , which she should take scheduled 3 times a day.  I also sent in a refill of his oxycodone , that he can take as needed for pain.    Patient states that he has had bowel movements without significant issue.  He is currently taking a combination stool softener and laxative.  I advised that he will need to continue taking this as he is now going to be taking more narcotic pain medications.  Patient also states that he still has a little bit of burning with urination and some difficulty with initiating his stream.  He states that he is able to urinate since being discharged from the emergency department.  He has not started taking Flomax  yet.  I advised him that while he was in the emergency department, he did require straight catheterization, so having that in addition to the Foley catheter at the time of surgery can cause significant urethral irritation which can sometimes result in burning with urination.  I did reassure him that his urinalysis in the emergency department did not demonstrate any concern for infection.  I advised that he should begin taking the Flomax  to see if this alleviates  some of his issues.  He does state that the burning has gotten better since he left the emergency department.  Advised that if he has any further issues, he can reach out to me via the switchboard after hours, or he can reach out to the office during normal business hours of Monday through Friday 8 AM to 5 PM.  If he has any serious concerns with worsening pain difficulty with urination or any other concerning symptoms, he should present to the emergency department for further evaluation.  All questions were answered to his and his wife's expressed satisfaction.  Dorothyann Brittle, DO [facility] [address] [place], [place] [address] [phone] (office)

## 2024-02-06 ENCOUNTER — Telehealth: Payer: Self-pay | Admitting: *Deleted

## 2024-02-06 NOTE — Telephone Encounter (Signed)
 Surgical Date: 02/01/2024 Procedure: XI ROBOTIC ASSISTED UMBILICAL HERNIA REPAIR W/ MESH  Received call from patient spouse, Kristen [phone]~ telephone.   Requested refill on pain medication.   Last Rx: Oxycodone  5 mg  02/04/2024 # 16 tabs

## 2024-02-07 NOTE — Telephone Encounter (Signed)
 Patient/ spouse received call from provider on 02/04/2024 after leaving VM on office phone.   Medication has been refilled.

## 2024-02-10 NOTE — Anesthesia Postprocedure Evaluation (Signed)
"   Anesthesia Post Note  Patient: Bruce Nguyen  Procedure(s) Performed: REPAIR, HERNIA, UMBILICAL, ROBOT-ASSISTED, WITH MESH (Abdomen)  Patient location during evaluation: Phase II Anesthesia Type: General Level of consciousness: awake Pain management: pain level controlled Vital Signs Assessment: post-procedure vital signs reviewed and stable Respiratory status: spontaneous breathing and respiratory function stable Cardiovascular status: blood pressure returned to baseline and stable Postop Assessment: no headache and no apparent nausea or vomiting Anesthetic complications: no Comments: Late entry   No notable events documented.   Last Vitals:  Vitals:   02/01/24 1100 02/01/24 1117  BP: (!) 175/134 139/87  Pulse: 93 97  Resp: 16 16  Temp: (!) 36.4 C 36.6 C  SpO2: (!) 89% 96%    Last Pain:  Vitals:   02/01/24 1117  TempSrc: Oral  PainSc: 5                  Yvonna PARAS Gwynneth Fabio      "

## 2024-02-21 ENCOUNTER — Other Ambulatory Visit: Payer: Self-pay

## 2024-02-21 ENCOUNTER — Ambulatory Visit (INDEPENDENT_AMBULATORY_CARE_PROVIDER_SITE_OTHER): Payer: Self-pay | Admitting: Surgery

## 2024-02-21 ENCOUNTER — Encounter: Payer: Self-pay | Admitting: Surgery

## 2024-02-21 VITALS — BP 156/107 | HR 87 | Temp 98.3°F | Resp 18 | Ht 73.0 in | Wt 387.0 lb

## 2024-02-21 DIAGNOSIS — Z09 Encounter for follow-up examination after completed treatment for conditions other than malignant neoplasm: Secondary | ICD-10-CM

## 2024-02-21 NOTE — Progress Notes (Signed)
 "    [facility] Note   HPI:  38 y.o. Male presents to clinic for post-op follow-up status post robotic assisted laparoscopic umbilical hernia repair with mesh on 12/10.  Patient is still having a fair amount of abdominal pain especially with certain movements.  He is currently only taking ibuprofen  and Tylenol  and taking occasional oxycodone  or Robaxin  for severe pain.  He is tolerating a diet without nausea and vomiting and moving his bowels without issue.  He has been continuing to use his abdominal binder for support.  He still is sleeping in a recliner at home, as he has difficulty getting in and out of the bed.  He has also had some panic attacks related to his CPAP machine when falling asleep, as this reminds him of when he was falling asleep under anesthesia.  Per the patient's father, he has been not wearing his CPAP, and is concerned as he has severe sleep apnea.  Patient denies any issues at his incision sites.  He denies fevers and chills.  Review of Systems:  All other review of systems: otherwise negative   Vital Signs:  BP (!) 156/107 (BP Location: Right Arm, Patient Position: Sitting, Cuff Size: Large)   Pulse 87   Temp 98.3 F (36.8 C) (Oral)   Resp 18   Ht 6' 1 (1.854 m)   Wt (!) 387 lb (175.5 kg)   BMI 51.06 kg/m    Physical Exam:  Physical Exam Vitals reviewed.  Constitutional:      Appearance: Normal appearance.  Abdominal:     Comments: Abdomen soft, nondistended, no percussion tenderness, tenderness to palpation at laparoscopic incision sites and around umbilicus; no rigidity, guarding, rebound tenderness; laparoscopic incision sites healing well with skin glue in place  Neurological:     Mental Status: He is alert.     Laboratory studies: None  Imaging:  None  Assessment:  38 y.o. yo Male who presents for follow-up status post robotic assisted laparoscopic umbilical hernia repair with mesh on 12/10.  Plan:  - Overall patient has been  doing well since surgery.  Abdominal pain is slowly improving, he is tolerating a diet, and moving his bowels - I advised that he is going to have some pains, especially with his size.  As long as the pain is slowly improving, there is nothing to be significantly worried about.  His pain may take some time to slowly improve.  Advised that he should continue taking as needed Tylenol  and ibuprofen . - Some of the skin glue was removed without issue.  I advised that at his inferior most incision site, he can use antibiotic ointment/Vaseline prior to showering to help get the remaining glue off - We also discussed that it sounds like he is having panic attacks when he is falling asleep with his CPAP machine.  I advised that he should try some exposure therapy where he wears the CPAP machine during the day to try to decrease his fear related to the mask being on his face.  If he is having severe difficulty secondary to panic attacks and is not wearing the mask, he needs to follow-up with his primary care doctor's office, as I do not want him to have respiratory issues related to not wearing his CPAP - Patient's wife will bring by [facility] paperwork for us  to fill out.  Will excuse him through 03/26/2024, which may be extended pending his phone follow-up - Phone follow-up with me on 03/20/2024  All of  the above recommendations were discussed with the patient and patient's family, and all of patient's and family's questions were answered to their expressed satisfaction.  Note: Portions of this report may have been transcribed using voice recognition software. Every effort has been made to ensure accuracy; however, inadvertent computerized transcription errors may still be present.   Dorothyann Brittle, DO [facility] [address] [place], [place] [address] [phone] (office) "

## 2024-02-29 ENCOUNTER — Telehealth: Payer: Self-pay | Admitting: Family Medicine

## 2024-02-29 NOTE — Telephone Encounter (Signed)
 STD paperwork completed and faxed to [facility] at [phone]. Confirmation received.   Out of work starting 02/01/2024. May return unrestricted on 03/26/2024.

## 2024-03-07 ENCOUNTER — Telehealth: Payer: Self-pay | Admitting: *Deleted

## 2024-03-07 NOTE — Telephone Encounter (Signed)
 Surgical Date: 02/01/2024 Procedure: XI ROBOTIC ASSISTED UMBILICAL HERNIA REPAIR W/ MESH   Received call from patient spouse, Kristen [phone]~ telephone. Patient noted in background of call as well.   Patient reports that he continues to have increased pain in his lower abdomen. States that incisions have healed well, but he feels like something is pulling in his lower abdomen when he bends or twists. States that he is unable to turn without significant pain.   Patient does state that he continues to use abdominal binder, APAP/ IBU with some relief. States that pain is improving slowly, but very slowly.   Advised that due to habitus, improvement after surgery may take longer than expected. Advised to start to increase physical activity slightly. Advised that he can reduce activity if he is unable to tolerate.  Advised to keep telephone appointment with provider. Advised to contact office if pain is worsening.

## 2024-03-20 ENCOUNTER — Ambulatory Visit (INDEPENDENT_AMBULATORY_CARE_PROVIDER_SITE_OTHER): Payer: Self-pay | Admitting: Surgery

## 2024-03-20 ENCOUNTER — Encounter: Payer: Self-pay | Admitting: Family Medicine

## 2024-03-20 DIAGNOSIS — G8918 Other acute postprocedural pain: Secondary | ICD-10-CM

## 2024-03-20 MED ORDER — OXYCODONE HCL 5 MG PO TABS: 5.0000 mg | ORAL_TABLET | Freq: Four times a day (QID) | ORAL | 0 refills | Status: AC | PRN

## 2024-03-20 NOTE — Progress Notes (Signed)
 [facility]  I am calling the patient for post operative evaluation. This is not a billable encounter as it is under the global charges for the surgery.  The patient had a robotic assisted laparoscopic umbilical hernia repair with mesh on 12/10.  Patient states that while his overall abdominal pain has improved, he still has pains when walking (and his belly is shifting up and down), with certain activities related to driving (hitting the brake and turning the wheel), and with bending over to put on his socks.  He states that he is still wearing the abdominal binder, as it does provide him some comfort.  He is currently just taking ibuprofen  as needed for pain.  He denies any issues at his incision sites.  We discussed that everyone is different after surgery, and he likely is having postoperative pains secondary to having surgery, decreased activity postoperatively, and his body habitus.  I explained that he does need to try to perform some of these activities that cause him pain to see if his pain is improving or if it is getting worse.  I will extend out the patient's FMLA paperwork for return to work on 04/23/2024.  I have also sent in a refill for oxycodone  to help him with pain.  Will plan for phone follow-up with the patient in 3 to 4 weeks to evaluate his pain at that time.  Advised that he should call the office if his pain is worsening, or present to the emergency department.  Will see the patient PRN.   Dorothyann Brittle, DO [facility] [address] [place], [place] [address] [phone] (office)

## 2024-03-30 NOTE — Telephone Encounter (Signed)
 Additional paperwork from [employer] completed and faxed at [phone]. Confirmation received.   New return to work date is 04/23/2024.    Notes faxed on 03/07/2024.

## 2024-04-19 ENCOUNTER — Encounter: Admitting: Surgery
